# Patient Record
Sex: Female | Born: 1945 | Race: Black or African American | Hispanic: No | Smoking: Never smoker
Health system: Southern US, Community
[De-identification: ages and names within clinical notes are randomized; demographics above are authoritative.]

## PROBLEM LIST (undated history)

## (undated) DIAGNOSIS — F29 Unspecified psychosis not due to a substance or known physiological condition: Secondary | ICD-10-CM

## (undated) DIAGNOSIS — I10 Essential (primary) hypertension: Secondary | ICD-10-CM

## (undated) DIAGNOSIS — F329 Major depressive disorder, single episode, unspecified: Secondary | ICD-10-CM

## (undated) DIAGNOSIS — Z8781 Personal history of (healed) traumatic fracture: Secondary | ICD-10-CM

## (undated) DIAGNOSIS — F32A Depression, unspecified: Secondary | ICD-10-CM

## (undated) HISTORY — DX: Unspecified psychosis not due to a substance or known physiological condition: F29

## (undated) HISTORY — DX: Essential (primary) hypertension: I10

## (undated) HISTORY — DX: Personal history of (healed) traumatic fracture: Z87.81

## (undated) HISTORY — DX: Depression, unspecified: F32.A

## (undated) HISTORY — DX: Major depressive disorder, single episode, unspecified: F32.9

---

## 1969-01-31 HISTORY — PX: TUBAL LIGATION: SHX77

## 1987-02-01 DIAGNOSIS — Z8781 Personal history of (healed) traumatic fracture: Secondary | ICD-10-CM

## 1987-02-01 HISTORY — DX: Personal history of (healed) traumatic fracture: Z87.81

## 2000-05-08 ENCOUNTER — Encounter: Admission: RE | Admit: 2000-05-08 | Discharge: 2000-05-26 | Payer: Self-pay | Admitting: Family Medicine

## 2003-02-01 HISTORY — PX: CARPAL TUNNEL RELEASE: SHX101

## 2003-04-10 ENCOUNTER — Ambulatory Visit (HOSPITAL_COMMUNITY): Admission: RE | Admit: 2003-04-10 | Discharge: 2003-04-10 | Payer: Self-pay | Admitting: General Surgery

## 2004-01-15 ENCOUNTER — Ambulatory Visit (HOSPITAL_BASED_OUTPATIENT_CLINIC_OR_DEPARTMENT_OTHER): Admission: RE | Admit: 2004-01-15 | Discharge: 2004-01-15 | Payer: Self-pay

## 2004-03-10 LAB — HM MAMMOGRAPHY: HM Mammogram: NORMAL

## 2005-04-14 ENCOUNTER — Ambulatory Visit: Payer: Self-pay | Admitting: Family Medicine

## 2005-05-26 ENCOUNTER — Ambulatory Visit: Payer: Self-pay | Admitting: Family Medicine

## 2005-07-11 ENCOUNTER — Ambulatory Visit: Payer: Self-pay | Admitting: Cardiology

## 2005-07-12 ENCOUNTER — Ambulatory Visit: Payer: Self-pay | Admitting: Cardiology

## 2005-07-12 ENCOUNTER — Observation Stay (HOSPITAL_COMMUNITY): Admission: AD | Admit: 2005-07-12 | Discharge: 2005-07-13 | Payer: Self-pay | Admitting: Internal Medicine

## 2005-07-14 ENCOUNTER — Emergency Department (HOSPITAL_COMMUNITY): Admission: EM | Admit: 2005-07-14 | Discharge: 2005-07-14 | Payer: Self-pay | Admitting: Emergency Medicine

## 2005-07-14 ENCOUNTER — Ambulatory Visit: Payer: Self-pay | Admitting: Family Medicine

## 2005-07-25 ENCOUNTER — Ambulatory Visit: Payer: Self-pay | Admitting: Family Medicine

## 2005-07-28 ENCOUNTER — Ambulatory Visit: Payer: Self-pay | Admitting: Cardiology

## 2007-06-11 ENCOUNTER — Encounter: Payer: Self-pay | Admitting: Family Medicine

## 2007-08-22 DIAGNOSIS — F329 Major depressive disorder, single episode, unspecified: Secondary | ICD-10-CM | POA: Insufficient documentation

## 2007-08-22 DIAGNOSIS — F29 Unspecified psychosis not due to a substance or known physiological condition: Secondary | ICD-10-CM | POA: Insufficient documentation

## 2007-08-22 DIAGNOSIS — F3289 Other specified depressive episodes: Secondary | ICD-10-CM | POA: Insufficient documentation

## 2007-10-01 ENCOUNTER — Encounter: Payer: Self-pay | Admitting: Family Medicine

## 2007-11-08 ENCOUNTER — Encounter: Payer: Self-pay | Admitting: Family Medicine

## 2008-01-08 ENCOUNTER — Encounter: Payer: Self-pay | Admitting: Family Medicine

## 2008-03-03 ENCOUNTER — Ambulatory Visit: Payer: Self-pay | Admitting: Family Medicine

## 2008-03-03 DIAGNOSIS — N950 Postmenopausal bleeding: Secondary | ICD-10-CM | POA: Insufficient documentation

## 2008-03-03 DIAGNOSIS — R5383 Other fatigue: Secondary | ICD-10-CM

## 2008-03-03 DIAGNOSIS — G4700 Insomnia, unspecified: Secondary | ICD-10-CM | POA: Insufficient documentation

## 2008-03-03 DIAGNOSIS — R5381 Other malaise: Secondary | ICD-10-CM | POA: Insufficient documentation

## 2008-03-04 ENCOUNTER — Encounter: Payer: Self-pay | Admitting: Family Medicine

## 2008-03-04 LAB — CONVERTED CEMR LAB
Basophils Absolute: 0 10*3/uL (ref 0.0–0.1)
Basophils Relative: 1 % (ref 0–1)
CO2: 24 meq/L (ref 19–32)
Calcium: 9.3 mg/dL (ref 8.4–10.5)
Cholesterol: 214 mg/dL — ABNORMAL HIGH (ref 0–200)
Eosinophils Absolute: 0.1 10*3/uL (ref 0.0–0.7)
Eosinophils Relative: 1 % (ref 0–5)
HDL: 83 mg/dL (ref >39)
Hemoglobin: 13.9 g/dL (ref 12.0–15.0)
LDL Cholesterol: 114 mg/dL — ABNORMAL HIGH (ref 0–99)
Lymphocytes Relative: 40 % (ref 12–46)
MCV: 82 fL (ref 78.0–100.0)
Monocytes Relative: 5 % (ref 3–12)
Neutro Abs: 2.4 10*3/uL (ref 1.7–7.7)
Potassium: 4 meq/L (ref 3.5–5.3)
RBC: 5.22 M/uL — ABNORMAL HIGH (ref 3.87–5.11)
Sodium: 143 meq/L (ref 135–145)
WBC: 4.4 10*3/uL (ref 4.0–10.5)

## 2008-03-19 ENCOUNTER — Encounter: Payer: Self-pay | Admitting: Family Medicine

## 2008-04-02 ENCOUNTER — Encounter: Payer: Self-pay | Admitting: Family Medicine

## 2010-03-30 ENCOUNTER — Encounter: Payer: Self-pay | Admitting: Family Medicine

## 2010-04-07 ENCOUNTER — Encounter (INDEPENDENT_AMBULATORY_CARE_PROVIDER_SITE_OTHER): Payer: BC Managed Care – PPO | Admitting: Family Medicine

## 2010-04-07 ENCOUNTER — Other Ambulatory Visit: Payer: Self-pay | Admitting: Family Medicine

## 2010-04-07 ENCOUNTER — Other Ambulatory Visit (HOSPITAL_COMMUNITY)
Admission: RE | Admit: 2010-04-07 | Discharge: 2010-04-07 | Disposition: A | Discharge status: Home / Self Care | Payer: BC Managed Care – PPO | Source: Ambulatory Visit | Attending: Family Medicine | Admitting: Family Medicine

## 2010-04-07 ENCOUNTER — Encounter: Payer: Self-pay | Admitting: Family Medicine

## 2010-04-07 DIAGNOSIS — Z23 Encounter for immunization: Secondary | ICD-10-CM

## 2010-04-07 DIAGNOSIS — Z1211 Encounter for screening for malignant neoplasm of colon: Secondary | ICD-10-CM

## 2010-04-07 DIAGNOSIS — Z0000 Encounter for general adult medical examination without abnormal findings: Secondary | ICD-10-CM

## 2010-04-07 DIAGNOSIS — Z124 Encounter for screening for malignant neoplasm of cervix: Secondary | ICD-10-CM

## 2010-04-07 DIAGNOSIS — Z139 Encounter for screening, unspecified: Secondary | ICD-10-CM

## 2010-04-07 DIAGNOSIS — Z01419 Encounter for gynecological examination (general) (routine) without abnormal findings: Secondary | ICD-10-CM | POA: Insufficient documentation

## 2010-04-07 DIAGNOSIS — Z2911 Encounter for prophylactic immunotherapy for respiratory syncytial virus (RSV): Secondary | ICD-10-CM

## 2010-04-07 LAB — CONVERTED CEMR LAB: OCCULT 1: NEGATIVE

## 2010-04-08 DIAGNOSIS — R7301 Impaired fasting glucose: Secondary | ICD-10-CM | POA: Insufficient documentation

## 2010-04-08 LAB — CONVERTED CEMR LAB
BUN: 13 mg/dL (ref 6–23)
CO2: 23 meq/L (ref 19–32)
Calcium: 8.7 mg/dL (ref 8.4–10.5)
Chloride: 106 meq/L (ref 96–112)
Eosinophils Relative: 3 % (ref 0–5)
Glucose, Bld: 81 mg/dL (ref 70–99)
HCT: 35.6 % — ABNORMAL LOW (ref 36.0–46.0)
Lymphs Abs: 1.8 10*3/uL (ref 0.7–4.0)
Monocytes Absolute: 0.3 10*3/uL (ref 0.1–1.0)
Monocytes Relative: 7 % (ref 3–12)
Neutro Abs: 2 10*3/uL (ref 1.7–7.7)
Platelets: 244 10*3/uL (ref 150–400)
Potassium: 3.9 meq/L (ref 3.5–5.3)
RBC: 4.84 M/uL (ref 3.87–5.11)
RDW: 17.5 % — ABNORMAL HIGH (ref 11.5–15.5)
Sodium: 142 meq/L (ref 135–145)
Total CHOL/HDL Ratio: 2.6
WBC: 4.2 10*3/uL (ref 4.0–10.5)

## 2010-04-08 LAB — CBC WITH DIFFERENTIAL/PLATELET
Basophils Relative: 0 % (ref 0–1)
HCT: 35.6 % — ABNORMAL LOW (ref 36.0–46.0)
Lymphocytes Relative: 43 % (ref 12–46)
MCV: 73.6 fL — ABNORMAL LOW (ref 78.0–100.0)
Monocytes Absolute: 0.3 10*3/uL (ref 0.1–1.0)
Neutro Abs: 2 10*3/uL (ref 1.7–7.7)
Platelets: 244 10*3/uL (ref 150–400)
RBC: 4.84 MIL/uL (ref 3.87–5.11)
WBC: 4.2 10*3/uL (ref 4.0–10.5)

## 2010-04-08 LAB — VITAMIN D 25 HYDROXY (VIT D DEFICIENCY, FRACTURES): Vit D, 25-Hydroxy: 12 ng/mL — ABNORMAL LOW (ref 30–89)

## 2010-04-08 LAB — LIPID PANEL
Cholesterol: 195 mg/dL (ref 0–200)
VLDL: 23 mg/dL (ref 0–40)

## 2010-04-08 LAB — ANEMIA PANEL
Ferritin: 10 ng/mL (ref 10–291)
Folate: >20 ng/mL
Iron: 88 ug/dL (ref 42–145)
RBC.: 4.78 MIL/uL (ref 3.87–5.11)
Retic Ct Pct: 0.9 % (ref 0.4–3.1)
UIBC: 368 ug/dL
Vitamin B-12: 548 pg/mL (ref 211–911)

## 2010-04-08 LAB — HEMOGLOBIN A1C: Hgb A1c MFr Bld: 6.3 % — ABNORMAL HIGH (ref <5.7)

## 2010-04-08 LAB — BASIC METABOLIC PANEL
BUN: 13 mg/dL (ref 6–23)
CO2: 23 mEq/L (ref 19–32)
Creat: 0.83 mg/dL (ref 0.40–1.20)
Potassium: 3.9 mEq/L (ref 3.5–5.3)
Sodium: 142 mEq/L (ref 135–145)

## 2010-04-12 ENCOUNTER — Encounter: Payer: Self-pay | Admitting: Family Medicine

## 2010-04-12 DIAGNOSIS — E669 Obesity, unspecified: Secondary | ICD-10-CM | POA: Insufficient documentation

## 2010-04-12 LAB — CONVERTED CEMR LAB: Retic Ct Pct: 0.9 % (ref 0.4–3.1)

## 2010-04-13 ENCOUNTER — Encounter: Payer: Self-pay | Admitting: Family Medicine

## 2010-04-15 ENCOUNTER — Ambulatory Visit (HOSPITAL_COMMUNITY)
Admission: RE | Admit: 2010-04-15 | Discharge: 2010-04-15 | Disposition: A | Discharge status: Home / Self Care | Payer: BC Managed Care – PPO | Source: Ambulatory Visit | Attending: Family Medicine | Admitting: Family Medicine

## 2010-04-15 DIAGNOSIS — Z139 Encounter for screening, unspecified: Secondary | ICD-10-CM

## 2010-04-15 DIAGNOSIS — Z1231 Encounter for screening mammogram for malignant neoplasm of breast: Secondary | ICD-10-CM | POA: Insufficient documentation

## 2010-04-19 ENCOUNTER — Telehealth: Payer: Self-pay | Admitting: Family Medicine

## 2010-04-20 NOTE — Letter (Signed)
Summary: Pap Smear, Normal Letter, [facility]  [address]   [place], [place] [address]   Phone: [phone]  Fax: [phone]          April 12, 2010    Dear: Amber Fernandez    I am pleased to notify you that your PAP smear was normal.  You will need your next PAP smear in:     ____ 3 Months    ____ 6 Months    ____ 12 Months    Please call the office at our office number above, to schedule your next appointment.    Sincerely,     [facility]

## 2010-04-20 NOTE — Letter (Signed)
Summary: lab add on  lab add on   Imported By: Luann Bullins 04/13/2010 08:57:47  _____________________________________________________________________  External Attachment:    Type:   Image     Comment:   External Document

## 2010-04-20 NOTE — Letter (Signed)
Summary: Letter  Letter   Imported By: Lind Guest 04/13/2010 10:35:32  _____________________________________________________________________  External Attachment:    Type:   Image     Comment:   External Document

## 2010-04-20 NOTE — Assessment & Plan Note (Signed)
Summary: physical   Vital Signs:  Patient profile:   65 year old female Menstrual status:  postmenopausal Height:      59 inches Weight:      161 pounds BMI:     32.64 O2 Sat:      99 % Pulse rate:   91 / minute Pulse rhythm:   regular Resp:     16 per minute BP sitting:   120 / 78  (left arm)  Vitals Entered By: Everitt Amber LPN (April 06, 1608 3:05 PM)  Nutrition Counseling: Patient's BMI is greater than 25 and therefore counseled on weight management options. CC: CPE  Vision Screening:Left eye with correction: 20 / 25 Right eye with correction: 20 / 25 Both eyes with correction: 20 / 20  Color vision testing: normal      Vision Entered By: Everitt Amber LPN (April 07, 9602 3:09 PM)     Menstrual Status postmenopausal   CC:  CPE.  History of Present Illness: Reports  that she is doing fairly well. Denies recent fever or chills. Denies sinus pressure, nasal congestion , ear pain or sore throat. Denies chest congestion, or cough productive of sputum. Denies chest pain, palpitations, PND, orthopnea or leg swelling. Denies abdominal pain, nausea, vomitting, diarrhea or constipation. Denies change in bowel movements or bloody stool. Denies dysuria , frequency, incontinence or hesitancy. . Denies headaches, vertigo, seizures. Denies depression, anxiety or insomnia.has been on mediation in the past but reports mental stability off meds for some time Denies  rash, lesions, or itch.     Current Medications (verified): 1)  None  Allergies (verified): 1)  ! Codeine  Past History:  Past Medical History: Current Problems:  UNSPECIFIED PSYCHOSIS (ICD-298.9) DEPRESSION (ICD-311) chronic leg pain s/p  trauma  Past Surgical History: Tubal ligation (1971) right hand surgery for carpal tunnel synd  and 1st dorsal compartment  dr Cypher 06/14/2003  Family History: Mother living - HTN, cancer of the vulva diagnosed in stage 61 age 40 in 06/14/2010 Father deceased 79 - cause  unknown Three sisters deceased - 2 at birth one sister living - healthy, one HTN, one died at age 50 in 2009-06-13 six brothers living - x2 diabetic, 1 died in 53 at age 40 due to cirrhosis and a second died at age 47 due to mI he was diabetic  Social History: Employed in distribution center Widow 4 children Never Smoked Alcohol use-no Drug use-no  Review of Systems      See HPI General:  Complains of fatigue. Eyes:  Denies discharge and red eye. MS:  Complains of joint pain, loss of strength, low back pain, and thoracic pain. Endo:  Denies cold intolerance, excessive hunger, excessive thirst, and excessive urination. Heme:  Denies abnormal bruising, bleeding, and enlarge lymph nodes. Allergy:  Denies hives or rash, itching eyes, and persistent infections.  Physical Exam  General:  Well-developed,well-nourished,in no acute distress; alert,appropriate and cooperative throughout examination Head:  Normocephalic and atraumatic without obvious abnormalities. No apparent alopecia or balding. Eyes:  No corneal or conjunctival inflammation noted. EOMI. Perrla. Funduscopic exam benign, without hemorrhages, exudates or papilledema. Vision grossly normal. Ears:  External ear exam shows no significant lesions or deformities.  Otoscopic examination reveals clear canals, tympanic membranes are intact bilaterally without bulging, retraction, inflammation or discharge. Hearing is grossly normal bilaterally. Nose:  External nasal examination shows no deformity or inflammation. Nasal mucosa are pink and moist without lesions or exudates. Mouth:  pharynx pink and moist  and fair dentition.   Neck:  No deformities, masses, or tenderness noted. Chest Wall:  No deformities, masses, or tenderness noted. Breasts:  No mass, nodules, thickening, tenderness, bulging, retraction, inflamation, nipple discharge or skin changes noted.   Lungs:  Normal respiratory effort, chest expands symmetrically. Lungs are clear to  auscultation, no crackles or wheezes. Heart:  Normal rate and regular rhythm. S1 and S2 normal without gallop, murmur, click, rub or other extra sounds. Abdomen:  Bowel sounds positive,abdomen soft and non-tender without masses, organomegaly or hernias noted. Rectal:  No external abnormalities noted. Normal sphincter tone. No rectal masses or tenderness. Genitalia:  Normal introitus for age, no external lesions, no vaginal discharge, mucosa pink and moist, no vaginal or cervical lesions, no vaginal atrophy, no friaility or hemorrhage, normal uterus size and position, no adnexal masses or tenderness Msk:  No deformity or scoliosis noted of thoracic or lumbar spine.   Pulses:  R and L carotid,radial,femoral,dorsalis pedis and posterior tibial pulses are full and equal bilaterally Extremities:  decreased rOm thoracolumbar spine , adequatein shoulders, hips and knees Neurologic:  No cranial nerve deficits noted. Station and gait are normal. Plantar reflexes are down-going bilaterally. DTRs are symmetrical throughout. Sensory, motor and coordinative functions appear intact. Skin:  Intact without suspicious lesions or rashes Cervical Nodes:  No lymphadenopathy noted Axillary Nodes:  No palpable lymphadenopathy Inguinal Nodes:  No significant adenopathy Psych:  Cognition and judgment appear intact. Alert and cooperative with normal attention span and concentration. No apparent delusions, illusions, hallucinations   Impression & Recommendations:  Problem # 1:  OBESITY (ICD-278.00) Assessment Comment Only  Ht: 59 (04/07/2010)   Wt: 161 (04/07/2010)   BMI: 32.64 (04/07/2010) therapeutic lifestyle change discussed and encouraged  Problem # 2:  IMPAIRED FASTING GLUCOSE (ICD-790.21) Assessment: Comment Only  Orders: T- Hemoglobin A1C (83036-23375)6.3 Pt advised to reduce carbohydrate intake, espescially sweets, and to start regular physical activity, at least 30 minutes 5 days weekly, to enable  weight loss, and reduce the risk of becoming diabetic   Complete Medication List: 1)  Aspir-low 81 Mg Tbec (Aspirin) .... Take 1 tablet by mouth once a day 2)  Oscal 500/200 D-3 500-200 Mg-unit Tabs (Calcium carbonate-vitamin d) .... Take 1 tablet by mouth three times a day 3)  Womens Multivitamin Plus Tabs (Multiple vitamins-minerals) .... Take 1 tablet by mouth once a day  Other Orders: T-Basic Metabolic Panel [id number]) T-Lipid Profile [phone]) T-CBC w/Diff [id number]) T-TSH [id number]) T-Vitamin D (25-Hydroxy) [id number]) Radiology Referral (Radiology) Zoster (Shingles) Vaccine Live [phone]) Admin 1st Vaccine ([address]) Pap Smear (03474) Hemoccult Guaiac-1 spec.(in office) (82270)  Patient Instructions: 1)  Follow up appointment in 5.76months 2)  It is important that you exercise regularly at least 20 minutes 5 times a week. If you develop chest pain, have severe difficulty breathing, or feel very tired , stop exercising immediately and seek medical attention. 3)  You need to lose weight. Consider a lower calorie diet and regular exercise.  4)  BMP prior to visit, ICD-9:chem 7, lipid and TSH labs today 5)  CBC w/ Diff prior to visit, ICD-9: 6)  Vitamin D 7)  Mamogram past due we will schedule. 8)  Zostavax today 9)  We will try and locate your colonscopy, if normal you need one every 10 yrs  Prescriptions: WOMENS MULTIVITAMIN PLUS  TABS (MULTIPLE VITAMINS-MINERALS) Take 1 tablet by mouth once a day  #100 x 3   Entered and Authorized by:   Syliva Overman MD   Signed by:  Syliva Overman MD on 04/07/2010   Method used:   Electronically to        [facility]* (retail)       [address]       [place], [place]  [address]       Ph: [phone]       Fax: [phone]   RxID:   [id number] OSCAL 500/200 D-3 500-200 MG-UNIT TABS (CALCIUM CARBONATE-VITAMIN D) Take 1 tablet by mouth three times a day  #90 x 11   Entered  and Authorized by:   Syliva Overman MD   Signed by:   Syliva Overman MD on 04/07/2010   Method used:   Electronically to        [facility]* (retail)       [address]       [place], [place]  [address]       Ph: [phone]       Fax: [phone]   RxID:   [id number] ASPIR-LOW 81 MG TBEC (ASPIRIN) Take 1 tablet by mouth once a day  #100 x 3   Entered and Authorized by:   Syliva Overman MD   Signed by:   Syliva Overman MD on 04/07/2010   Method used:   Electronically to        [facility]* (retail)       [address]       [place], [place]  [address]       Ph: [phone]       Fax: [phone]   RxID:   [id number]    Orders Added: 1)  Est. Patient 40-64 years [[address]396] 2)  T-Basic Metabolic Panel [id number] 3)  T-Lipid Profile [id number] 4)  T-CBC w/Diff [99371-69678] 5)  T-TSH [93810-17510] 6)  T-Vitamin D (25-Hydroxy) [id number] 7)  T- Hemoglobin A1C [83036-23375] 8)  Radiology Referral [Radiology] 9)  Zoster (Shingles) Vaccine Live [90736] 10)  Admin 1st Vaccine [90471] 11)  Pap Smear [88150] 12)  Hemoccult Guaiac-1 spec.(in office) [82270]   Immunizations Administered:  Zostavax # 1:    Vaccine Type: Zostavax    Site: left deltoid    Mfr: Merck    Dose: 0.5 ml    Route: IM    Given by: Adella Hare LPN    Exp. Date: 12/06/2010    Lot #: [id number]    VIS given: 11/12/04 given April 07, 2010.   Immunizations Administered:  Zostavax # 1:    Vaccine Type: Zostavax    Site: left deltoid    Mfr: Merck    Dose: 0.5 ml    Route: IM    Given by: Adella Hare LPN    Exp. Date: 12/06/2010    Lot #: [id number]    VIS given: 11/12/04 given April 07, 2010.     Laboratory Results  Date/Time Received: April 07, 2010 4:00 PM  Date/Time Reported: April 07, 2010 4:00 PM   Stool - Occult Blood Hemmoccult #1: negative Date: 04/07/2010 Comments: 5030  5/14 51301 13L 10/13 Adella Hare LPN  April 06, 84 4:01 PM

## 2010-04-21 ENCOUNTER — Telehealth: Payer: Self-pay | Admitting: Family Medicine

## 2010-04-22 NOTE — Telephone Encounter (Signed)
Advise pt that since tingling  Continues to be a concern, she needs to have nerve conduction twests done for further eval pls let me know if she want sa referral now

## 2010-04-23 ENCOUNTER — Other Ambulatory Visit: Payer: Self-pay | Admitting: Family Medicine

## 2010-04-23 DIAGNOSIS — G5600 Carpal tunnel syndrome, unspecified upper limb: Secondary | ICD-10-CM

## 2010-04-29 NOTE — Progress Notes (Signed)
  Phone Note Call from Patient   Summary of Call: Patient called in and states that where she recieved the Zostavax on 04/07/10 was red around the area and still sore and feels like its numb also. Wants to know what needs to be done about it Call at work [phone] Initial call taken by: Everitt Amber LPN,  April 19, 2010 10:16 AM  Follow-up for Phone Call        nurse visit asap to look at the area ensure not infected, in the interim, use tylenol for pain  Follow-up by: Syliva Overman MD,  April 19, 2010 11:19 AM  Additional Follow-up for Phone Call Additional follow up Details #1::        Patient can't come until tomorrow around 2pm. Will use Tylenol in the meantime Additional Follow-up by: Everitt Amber LPN,  April 19, 2010 11:36 AM

## 2010-05-28 ENCOUNTER — Encounter: Payer: Self-pay | Admitting: Family Medicine

## 2010-06-18 NOTE — Discharge Summary (Signed)
NAMEMARYCARMEN, HAGEY NO.:  [id number]   MEDICAL RECORD NO.:  [id number]          PATIENT TYPE:  INP   LOCATION:  3702                         FACILITY:  [facility]   PHYSICIAN:  Jonelle Sidle, M.D. LHCDATE OF BIRTH:  November 27, 1945   DATE OF ADMISSION:  07/12/2005  DATE OF DISCHARGE:  07/13/2005                                 DISCHARGE SUMMARY   PRIMARY CARDIOLOGIST:  Dr. Andee Lineman.   PRINCIPAL DIAGNOSIS:  1.  Noncardiac chest pain.      1.  Normal coronary angiogram.   SECONDARY DIAGNOSES:  1.  History of hyperlipidemia.  2.  Chronic back pain.  3.  Family history of premature coronary artery disease.   PROCEDURES:  Diagnostic coronary angiogram by Dr. Charlies Constable June 12.   REASON FOR ADMISSION:  Ms. Kocsis is a 65 year old female with no prior  cardiac history who was initially referred to Dr. Lewayne Bunting at [facility] for evaluation of chest pain felt to have typical/atypical  features.  Serial cardiac markers were negative.  The patient was referred  for diagnostic coronary angiography for definitive exclusion of significant  coronary artery disease.   HOSPITAL COURSE:  Following transfer, the patient underwent same-day cardiac  catheterization by Dr. Charlies Constable revealing normal coronary arteries,  normal left ventricle, and a normal ascending aorta.  There were no noted  complications.   The patient was kept for overnight observation, cleared for discharge the  following morning in hemodynamically stable condition.  There were no  complications of the right groin incision site.   DISCHARGE LABORATORIES:  WBC 3.9, hemoglobin 13.5, platelet 210.  Sodium  140, potassium 4.3, glucose 106, BUN 13, creatinine 1.9.   MEDICATIONS:  None.   INSTRUCTIONS:  Patient is referred to cardiac catheterization discharge  sheet.   FOLLOW-UP:  1.  Dr. Lewayne Bunting on June 28 at 12:10 p.m. at [facility] in [place]      for a follow-up groin check.  2.   Patient instructed to arrange follow-up with Dr. Syliva Overman in one      week.   DISPOSITION:  Stable.  Dictation duration less than 30 medicines.      Gene Serpe, P.A. [facility]      Jonelle Sidle, M.D. [facility]  Electronically Signed    GS/MEDQ  D:  07/13/2005  T:  07/13/2005  Job:  [id number]   cc:   [facility] [place]  [address]  [place], [place] [address]   Milus Mallick. Lodema Hong, M.D.  Fax: [phone]

## 2010-06-18 NOTE — Op Note (Signed)
Amber Fernandez, Amber Fernandez               ACCOUNT NO.:  [id number]   MEDICAL RECORD NO.:  [id number]          PATIENT TYPE:  AMB   LOCATION:  DSC                          FACILITY:  [facility]   PHYSICIAN:  Katy Fitch. Sypher Montez Hageman., M.D.DATE OF BIRTH:  [birth year]   DATE OF PROCEDURE:  01/15/2004  DATE OF DISCHARGE:                                 OPERATIVE REPORT   PREOPERATIVE DIAGNOSES:  1.  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  2.  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   POSTOPERATIVE DIAGNOSES:  1.  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  2.  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   OPERATION:  1.  Release of right first dorsal compartment with resection of septum      between extensor pollicis brevis and abductor pollicis longus tendons.  2.  Release of right transverse carpal ligament through separate palmar      incision.   SURGEON:  Katy Fitch. Sypher, M.D.   ASSISTANT:  Marveen Reeks. Dasnoit, P.A.C.   ANESTHESIA:  General by LMA.   SUPERVISING ANESTHESIOLOGIST:  Janetta Hora. Gelene Mink, M.D.   INDICATIONS FOR PROCEDURE:  Gerlene Glassburn is a 65 year old right-hand  dominant woman referred by Dr. Lunette Stands for evaluation and management of  a painful right wrist and hand numbness.  Clinical examination revealed  signs of chronic stenosing tenosynovitis of her right first dorsal  compartment and signs of chronic median entrapment neuropathy.  Electrodiagnostic studies performed by Dr. Johna Roles confirmed median  neuropathy bilaterally.   Ms. Redditt had prior steroids injections and a prolonged period of splinting  without relief of her first dorsal compartment swelling and pain.   She is now advised to proceed with release of the first dorsal compartment  with tenolysis of the abductor pollicis longus and extensor pollicis brevis  tendons as well as release of the right  transverse carpal ligament.   DESCRIPTION OF PROCEDURE:  Jakelyn Squyres is brought to the operating room  and placed in the supine position on the operating table.   Following the induction of general anesthesia by LMA, the right arm was  prepped with Betadine soap and solution and sterilely draped.  Following  exsanguination of the limb with the Esmarch bandage, the arterial tourniquet  was inflated to 220 mmHg.  Procedure commenced with a short transverse  incision directly over the palpably thickened first dorsal compartment.  Subcutaneous tissue was carefully divided revealing an opaque very thickened  compartment. This was then carefully isolated with retraction of the radial  sensory branches followed by incision with the scalpel.   The wall thickness of the compartment had increased to more than 4 mm and  there were significant areas of calcification within the substance of the  retinaculum.   The scissors were used to extend the release proximally and distally  exposing a large caliber extensor pollicis brevis tendon.  A second more  palmar incision was fashioned revealing two slips of the adductor pollicis  longus.   The rather thickened septum between the two compartments was  resected with  scissors while carefully protecting the nerves.   Thereafter, free range of motion of the thumb and wrist was recovered.   This wound was then repaired with intradermal 3-0 Prolene and a Steri-Strip.   Attention was then directed to the palm.  A short incision was fashioned in  line of the ring finger.  Subcutaneous tissues were carefully divided along  the palmar fascia.  This was split longitudinally __________ branch of the  median nerve.  These were followed back towards transverse carpal ligament  which was carefully isolated from the median nerve.  The ligament was  released along its ulnar border extending to the distal forearm.  This  widely opened carpal canal.  No masses or  other predicaments were noted.   Bleeding points along the margin of the released ligament were  electrocauterized with bipolar current followed by repair of the skin with  intradermal 3-0 Prolene suture.   A compressive dressing applied with volar plaster splint maintaining the  wrist in 5 degrees of dorsiflexion.   For aftercare, Ms. Heather was given a prescription for Dilaudid 2 mg one or  two tablets p.o. q.4-6h. p.r.n. pain, 20 tablets without refill.  She is  also encouraged to use Advil or Tylenol as an over-the-counter analgesic for  mild pain.      [place]   RVS/MEDQ  D:  01/15/2004  T:  01/15/2004  Job:  [id number]   cc:   Lunette Stands, M.D.  [address]  [place] [address]  Fax: [phone]

## 2010-06-18 NOTE — Cardiovascular Report (Signed)
NAME:  Amber Fernandez, Amber Fernandez NO.:  [id number]   MEDICAL RECORD NO.:  [id number]          PATIENT TYPE:  INP   LOCATION:  3702                         FACILITY:  [facility]   PHYSICIAN:  Charlies Constable, M.D. [facility] DATE OF BIRTH:  01/01/1946   DATE OF PROCEDURE:  07/12/2005  DATE OF DISCHARGE:                              CARDIAC CATHETERIZATION   CLINICAL HISTORY:  Ms. Gilani is 65 years old and was recently admitted to  [facility] with chest pain.  She was seen in consultation by Dr.  Andee Lineman who felt that she should have evaluation with coronary angiography  and she was transferred to [facility] for this.  She does have a  brother who died of a myocardial infarction at age 97, according to Dr.  Margarita Mail note.   PROCEDURE:  The procedure was performed via right femoral artery and  arterial sheath and 6-French preformed coronary catheters.  Omnipaque  contrast was used.  An aortic root injection was performed to rule out  aortic dissection.  The right femoral was closed with Angioseal at the end  of the procedure.  The patient tolerated the procedure well and left the  laboratory in satisfactory condition.   RESULTS:  The aortic pressure was 120/71 with a mean of 92 and the left  ventricular pressure was 120/9.   Left main coronary artery:  Left main coronary artery was free of  significant disease.   Left anterior descending artery:  Left descending artery gave rise to three  diagonal branches and four septal perforators.  These and the LV proper were  free of significant disease.   Circumflex:  The circumflex gave rise to an atrial branch, two marginal  branches and two posterolateral branches.  These vessels were free of  disease.   Right coronary artery:  The right coronary artery was a moderate-sized  vessel that gave rise to a right ventricle branch, second right ventricle  branch which supplied part of the inferior septum and the posterior  descending and posterolateral branch.  These vessels were free of  significant disease.   Left ventriculogram:  The left ventriculogram from the RA projection showed  good wall motion with no areas of hypokinesis.  The estimated fraction was  60%.   Aortic root injection:  Aortic root injection showed no evidence of  dissection.   CONCLUSION:  Normal coronary angiography and left ventricular wall motion.   RECOMMENDATIONS:  Reassurance.  Will plan to keep the patient and discharge  the patient tomorrow.  Will arrange follow-up with Dr. Andee Lineman and Dr. Clelia Croft  and they can decide regarding further evaluation of the patient's chest  pain.           ______________________________  Charlies Constable, M.D. [facility]     BB/MEDQ  D:  07/12/2005  T:  07/12/2005  Job:  [id number]   cc:   Sherryll Burger, M.D.  [place], [place]   Learta Codding, M.D. [facility]  [address]  [address]  [place]  [place] [address]

## 2010-06-18 NOTE — H&P (Signed)
Amber Fernandez, PERSON NO.:  [id number]   MEDICAL RECORD NO.:  [id number]                  PATIENT TYPE:   LOCATION:                                       FACILITY:  [facility]   PHYSICIAN:  Dirk Dress. Katrinka Blazing, M.D.                DATE OF BIRTH:   DATE OF ADMISSION:  DATE OF DISCHARGE:                                HISTORY & PHYSICAL   HISTORY OF PRESENT ILLNESS:  [age]-year-old female referred for  screening colonoscopy.  She has a history of anemia that is being evaluated.  She has not had difficulty with bowel movements.  She denies bleeding or  melena.  She denies abdominal pain.  She is scheduled for screening  colonoscopy.   PAST HISTORY:  1. She has a history of chronic anemia.  2. A prior history of left ankle fracture.   PAST SURGICAL HISTORY:  Surgery includes tubal ligation.   FAMILY HISTORY:  Family history is positive for hypertension, diabetes and a  stroke.   PHYSICAL EXAMINATION:  GENERAL:  On exam, a healthy-appearing female in no  acute distress.  VITAL SIGNS:  Blood pressure 140/86, pulse 80, respirations 18.  Weight 144  pounds.  HEENT:  Normal.  NECK:  Neck is supple.  CHEST:  Chest clear to auscultation.  HEART:  Regular rate and rhythm without murmur, gallop or rub.  ABDOMEN:  Abdomen is soft and nontender.  No masses.  EXTREMITIES:  No cyanosis, clubbing or edema.  NEUROLOGIC:  No focal motor, sensory or cerebellar deficit.   IMPRESSION:  1. Need for screening colonoscopy.  2. Anemia.   PLAN:  Screening colonoscopy.     ___________________________________________                                         Dirk Dress. Katrinka Blazing, M.D.   LCS/MEDQ  D:  04/09/2003  T:  04/10/2003  Job:  [id number]

## 2010-09-20 ENCOUNTER — Encounter: Payer: Self-pay | Admitting: Family Medicine

## 2010-09-21 ENCOUNTER — Encounter: Payer: Self-pay | Admitting: Family Medicine

## 2010-09-21 ENCOUNTER — Ambulatory Visit (INDEPENDENT_AMBULATORY_CARE_PROVIDER_SITE_OTHER): Payer: BC Managed Care – PPO | Admitting: Family Medicine

## 2010-09-21 VITALS — BP 120/80 | HR 72 | Resp 16 | Ht 59.0 in | Wt 165.1 lb

## 2010-09-21 DIAGNOSIS — D509 Iron deficiency anemia, unspecified: Secondary | ICD-10-CM

## 2010-09-21 DIAGNOSIS — R7301 Impaired fasting glucose: Secondary | ICD-10-CM

## 2010-09-21 DIAGNOSIS — R7303 Prediabetes: Secondary | ICD-10-CM

## 2010-09-21 DIAGNOSIS — M549 Dorsalgia, unspecified: Secondary | ICD-10-CM

## 2010-09-21 DIAGNOSIS — Z23 Encounter for immunization: Secondary | ICD-10-CM

## 2010-09-21 DIAGNOSIS — R5381 Other malaise: Secondary | ICD-10-CM

## 2010-09-21 DIAGNOSIS — D649 Anemia, unspecified: Secondary | ICD-10-CM

## 2010-09-21 DIAGNOSIS — R7309 Other abnormal glucose: Secondary | ICD-10-CM

## 2010-09-21 DIAGNOSIS — E559 Vitamin D deficiency, unspecified: Secondary | ICD-10-CM

## 2010-09-21 DIAGNOSIS — E669 Obesity, unspecified: Secondary | ICD-10-CM

## 2010-09-21 DIAGNOSIS — R5383 Other fatigue: Secondary | ICD-10-CM

## 2010-09-21 MED ORDER — IBUPROFEN 800 MG PO TABS: 800.0000 mg | ORAL_TABLET | Freq: Three times a day (TID) | ORAL | Status: AC | PRN

## 2010-09-21 MED ORDER — METHYLPREDNISOLONE ACETATE 80 MG/ML IJ SUSP
80.0000 mg | Freq: Once | INTRAMUSCULAR | Status: AC
  Administered 2010-09-21: 80 mg via INTRAMUSCULAR

## 2010-09-21 MED ORDER — KETOROLAC TROMETHAMINE 30 MG/ML IJ SOLN
60.0000 mg | Freq: Once | INTRAMUSCULAR | Status: AC
  Administered 2010-09-21: 60 mg via INTRAMUSCULAR

## 2010-09-21 MED ORDER — PREDNISONE (PAK) 5 MG PO TABS: 5.0000 mg | ORAL_TABLET | ORAL | Status: DC

## 2010-09-21 NOTE — Patient Instructions (Signed)
F/u in  3.5 months.  Labs today.  Pls call and register for nutrition class, that will help you A LOT  Non fasting labd  5 to 7 days befiore next oV.  LABWORK  NEEDS TO BE DONE BETWEEN 3 TO 7 DAYS BEFORE YOUR NEXT SCEDULED  VISIT.  THIS WILL IMPROVE THE QUALITY OF YOUR CARE.   Injections and med for arthritic back pain.  It is important that you exercise regularly at least 30 minutes 5 times a week. If you develop chest pain, have severe difficulty breathing, or feel very tired, stop exercising immediately and seek medical attention    A healthy diet is rich in fruit, vegetables and whole grains. Poultry fish, nuts and beans are a healthy choice for protein rather then red meat. A low sodium diet and drinking 64 ounces of water daily is generally recommended. Oils and sweet should be limited. Carbohydrates especially for those who are diabetic or overweight, should be limited to 30-45 gram per meal. It is important to eat on a regular schedule, at least 3 times daily. Snacks should be primarily fruits, vegetables or nuts.

## 2010-09-21 NOTE — Progress Notes (Signed)
  Subjective:    Patient ID: Amber Fernandez, female    DOB: 1945/11/18, 65 y.o.   MRN: [id number]  HPI 3 week h/o left SI back pain, non radiating, was doing yard work, the day it started, no relief since then . Does not radiate, first time ever, no incontinence , no lower extremtiy weakness, or numbness. Pt has not been consistent in exercise and dietary change to promote weight loss despite being prediabetic, she has actually gained weigh, however vows to work to change this  Review of Systems See HPI Denies recent fever or chills. Denies sinus pressure, nasal congestion, ear pain or sore throat. Denies chest congestion, productive cough or wheezing. Denies chest pains, palpitations and leg swelling Denies abdominal pain, nausea, vomiting,diarrhea or constipation.   Denies dysuria, frequency, hesitancy or incontinence.  Denies headaches, seizures, numbness, or tingling. Denies depression, anxiety or insomnia. Denies skin break down or rash.        Objective:   Physical Exam Patient alert and oriented and in no cardiopulmonary distress.  HEENT: No facial asymmetry, EOMI, no sinus tenderness,  oropharynx pink and moist.  Neck supple no adenopathy.  Chest: Clear to auscultation bilaterally.  CVS: S1, S2 no murmurs, no S3.  ABD: Soft non tender. Bowel sounds normal.  Ext: No edema  DG:[id number] ROM spine,adequate in  shoulders, hips and knees.  Skin: Intact, no ulcerations or rash noted.  Psych: Good eye contact, normal affect. Memory intact not anxious or depressed appearing.  CNS: CN 2-12 intact, power, tone and sensation normal throughout.        Assessment & Plan:

## 2010-09-22 LAB — IRON AND TIBC
%SAT: 14 % — ABNORMAL LOW (ref 20–55)
Iron: 58 ug/dL (ref 42–145)
TIBC: 425 ug/dL (ref 250–470)
UIBC: 367 ug/dL

## 2010-09-22 LAB — CBC WITH DIFFERENTIAL/PLATELET
Basophils Relative: 1 % (ref 0–1)
Eosinophils Relative: 6 % — ABNORMAL HIGH (ref 0–5)
MCH: 25.3 pg — ABNORMAL LOW (ref 26.0–34.0)
Monocytes Relative: 6 % (ref 3–12)
Neutrophils Relative %: 42 % — ABNORMAL LOW (ref 43–77)
RDW: 15.7 % — ABNORMAL HIGH (ref 11.5–15.5)
WBC: 3.7 10*3/uL — ABNORMAL LOW (ref 4.0–10.5)

## 2010-09-22 LAB — FERRITIN: Ferritin: 17 ng/mL (ref 10–291)

## 2010-09-22 LAB — VITAMIN D 25 HYDROXY (VIT D DEFICIENCY, FRACTURES): Vit D, 25-Hydroxy: 20 ng/mL — ABNORMAL LOW (ref 30–89)

## 2010-09-22 LAB — FOLATE: Folate: >20 ng/mL

## 2010-09-22 MED ORDER — ERGOCALCIFEROL 1.25 MG (50000 UT) PO CAPS: 50000.0000 [IU] | ORAL_CAPSULE | ORAL | Status: AC

## 2010-10-04 NOTE — Assessment & Plan Note (Signed)
Behavioral modification discussed to reduce the risk of becoming diabetic or delay its onset

## 2010-10-04 NOTE — Assessment & Plan Note (Signed)
Anti inflammatories administered and prescribed 

## 2010-10-04 NOTE — Assessment & Plan Note (Signed)
Deteriorated. Patient re-educated about  the importance of commitment to a  minimum of 150 minutes of exercise per week. The importance of healthy food choices with portion control discussed. Encouraged to start a food diary, count calories and to consider  joining a support group. Sample diet sheets offered. Goals set by the patient for the next several months.    

## 2010-12-29 ENCOUNTER — Encounter: Payer: Self-pay | Admitting: Family Medicine

## 2011-01-04 ENCOUNTER — Ambulatory Visit: Payer: BC Managed Care – PPO | Admitting: Family Medicine

## 2012-04-18 ENCOUNTER — Ambulatory Visit (INDEPENDENT_AMBULATORY_CARE_PROVIDER_SITE_OTHER): Payer: BC Managed Care – PPO | Admitting: Family Medicine

## 2012-04-18 ENCOUNTER — Other Ambulatory Visit: Payer: Self-pay | Admitting: Family Medicine

## 2012-04-18 ENCOUNTER — Other Ambulatory Visit (HOSPITAL_COMMUNITY)
Admission: RE | Admit: 2012-04-18 | Discharge: 2012-04-18 | Disposition: A | Discharge status: Home / Self Care | Payer: BC Managed Care – PPO | Source: Ambulatory Visit | Attending: Family Medicine | Admitting: Family Medicine

## 2012-04-18 ENCOUNTER — Encounter: Payer: Self-pay | Admitting: Family Medicine

## 2012-04-18 VITALS — BP 124/80 | HR 99 | Resp 18 | Ht 59.0 in | Wt 158.0 lb

## 2012-04-18 DIAGNOSIS — Z1151 Encounter for screening for human papillomavirus (HPV): Secondary | ICD-10-CM | POA: Insufficient documentation

## 2012-04-18 DIAGNOSIS — Z139 Encounter for screening, unspecified: Secondary | ICD-10-CM

## 2012-04-18 DIAGNOSIS — R5383 Other fatigue: Secondary | ICD-10-CM

## 2012-04-18 DIAGNOSIS — Z01419 Encounter for gynecological examination (general) (routine) without abnormal findings: Secondary | ICD-10-CM | POA: Insufficient documentation

## 2012-04-18 DIAGNOSIS — E669 Obesity, unspecified: Secondary | ICD-10-CM

## 2012-04-18 DIAGNOSIS — R7301 Impaired fasting glucose: Secondary | ICD-10-CM

## 2012-04-18 DIAGNOSIS — Z1211 Encounter for screening for malignant neoplasm of colon: Secondary | ICD-10-CM

## 2012-04-18 DIAGNOSIS — M899 Disorder of bone, unspecified: Secondary | ICD-10-CM

## 2012-04-18 DIAGNOSIS — R7309 Other abnormal glucose: Secondary | ICD-10-CM

## 2012-04-18 DIAGNOSIS — M949 Disorder of cartilage, unspecified: Secondary | ICD-10-CM

## 2012-04-18 DIAGNOSIS — R5381 Other malaise: Secondary | ICD-10-CM

## 2012-04-18 DIAGNOSIS — Z1329 Encounter for screening for other suspected endocrine disorder: Secondary | ICD-10-CM

## 2012-04-18 DIAGNOSIS — R7303 Prediabetes: Secondary | ICD-10-CM

## 2012-04-18 DIAGNOSIS — Z0000 Encounter for general adult medical examination without abnormal findings: Secondary | ICD-10-CM | POA: Insufficient documentation

## 2012-04-18 DIAGNOSIS — Z13220 Encounter for screening for lipoid disorders: Secondary | ICD-10-CM

## 2012-04-18 NOTE — Patient Instructions (Addendum)
F/u in 6 month, call if you need me before please.  Mammogram to be scheduled at checkout.  It is important that you exercise regularly at least 30 minutes 5 times a week. If you develop chest pain, have severe difficulty breathing, or feel very tired, stop exercising immediately and seek medical attention   Please work on weight loss since you are prediabetic   Fasting lipid, chem 7, hBA1C, TSH and CBC and vit D as soon as possible  HBa1C in 6 month

## 2012-04-19 LAB — CBC
HCT: 40.7 % (ref 36.0–46.0)
Hemoglobin: 13.6 g/dL (ref 12.0–15.0)

## 2012-04-19 LAB — LIPID PANEL
Cholesterol: 200 mg/dL (ref 0–200)
HDL: 77 mg/dL (ref >39)
LDL Cholesterol: 100 mg/dL — ABNORMAL HIGH (ref 0–99)

## 2012-04-19 LAB — BASIC METABOLIC PANEL
CO2: 29 mEq/L (ref 19–32)
Chloride: 104 mEq/L (ref 96–112)
Creat: 0.74 mg/dL (ref 0.50–1.10)
Glucose, Bld: 92 mg/dL (ref 70–99)
Potassium: 4.5 mEq/L (ref 3.5–5.3)
Sodium: 139 mEq/L (ref 135–145)

## 2012-04-20 LAB — VITAMIN D 25 HYDROXY (VIT D DEFICIENCY, FRACTURES): Vit D, 25-Hydroxy: 34 ng/mL (ref 30–89)

## 2012-04-20 LAB — TSH: TSH: 1.636 u[IU]/mL (ref 0.350–4.500)

## 2012-04-22 NOTE — Progress Notes (Signed)
  Subjective:    Patient ID: Amber Fernandez, female    DOB: 1945-02-20, 67 y.o.   MRN: [id number]  HPI The PT is here for annual exam she is on no chronic prescription medications  Preventive health is updated, specifically  Cancer screening and Immunization.   There are no new concerns.  There are no specific complaints       Review of Systems See HPI Denies recent fever or chills. Denies sinus pressure, nasal congestion, ear pain or sore throat. Denies chest congestion, productive cough or wheezing. Denies chest pains, palpitations and leg swelling Denies abdominal pain, nausea, vomiting,diarrhea or constipation.   Denies dysuria, frequency, hesitancy or incontinence. Denies joint pain, swelling and limitation in mobility. Denies headaches, seizures, numbness, or tingling. Denies depression, anxiety or insomnia. Denies skin break down or rash.        Objective:   Physical Exam  Pleasant well nourished female, alert and oriented x 3, in no cardio-pulmonary distress. Afebrile. HEENT No facial trauma or asymetry. Sinuses non tender.  EOMI, PERTL, fundoscopic exam is normal, no hemorhage or exudate.  External ears normal, tympanic membranes clear. Oropharynx moist, no exudate, good dentition. Neck: supple, no adenopathy,JVD or thyromegaly.No bruits.  Chest: Clear to ascultation bilaterally.No crackles or wheezes. Non tender to palpation  Breast: No asymetry,no masses. No nipple discharge or inversion. No axillary or supraclavicular adenopathy  Cardiovascular system; Heart sounds normal,  S1 and  S2 ,no S3.  No murmur, or thrill. Apical beat not displaced Peripheral pulses normal.  Abdomen: Soft, non tender, no organomegaly or masses. No bruits. Bowel sounds normal. No guarding, tenderness or rebound.  Rectal:  No mass. Guaiac negative stool.  GU: External genitalia normal. No lesions. Vaginal canal normal.No discharge. Uterus normal size, no adnexal  masses, no cervical motion or adnexal tenderness.  Musculoskeletal exam: Full ROM of spine, hips , shoulders and knees. No deformity ,swelling or crepitus noted. No muscle wasting or atrophy.   Neurologic: Cranial nerves 2 to 12 intact. Power, tone ,sensation and reflexes normal throughout. No disturbance in gait. No tremor.  Skin: Intact, no ulceration, erythema , scaling or rash noted. Pigmentation normal throughout  Psych; Normal mood and affect. Judgement and concentration normal       Assessment & Plan:

## 2012-04-22 NOTE — Assessment & Plan Note (Addendum)
Annual exam as documented. Pt to work on regular exercise , dietary change and weight loss Cancer screeening needs updating, mammogram

## 2012-04-22 NOTE — Assessment & Plan Note (Signed)
Deteriorated Patient educated about the importance of limiting  Carbohydrate intake , the need to commit to daily physical activity for a minimum of 30 minutes , and to commit weight loss. The fact that changes in all these areas will reduce or eliminate all together the development of diabetes is stressed.    

## 2012-04-23 ENCOUNTER — Ambulatory Visit (HOSPITAL_COMMUNITY): Payer: BC Managed Care – PPO

## 2012-04-24 ENCOUNTER — Ambulatory Visit (HOSPITAL_COMMUNITY)
Admission: RE | Admit: 2012-04-24 | Discharge: 2012-04-24 | Disposition: A | Discharge status: Home / Self Care | Payer: BC Managed Care – PPO | Source: Ambulatory Visit | Attending: Family Medicine | Admitting: Family Medicine

## 2012-04-24 DIAGNOSIS — Z139 Encounter for screening, unspecified: Secondary | ICD-10-CM

## 2012-04-24 DIAGNOSIS — Z1231 Encounter for screening mammogram for malignant neoplasm of breast: Secondary | ICD-10-CM | POA: Insufficient documentation

## 2012-05-08 NOTE — Addendum Note (Signed)
Addended by: Kandis Fantasia B on: 05/08/2012 11:31 AM   Modules accepted: Orders

## 2012-05-22 ENCOUNTER — Telehealth: Payer: Self-pay | Admitting: Family Medicine

## 2012-05-22 NOTE — Telephone Encounter (Signed)
Called to see why she was needing this cream. Left message to call back

## 2012-05-30 NOTE — Telephone Encounter (Signed)
Patient never returned call  

## 2012-07-03 ENCOUNTER — Telehealth: Payer: Self-pay

## 2012-07-03 NOTE — Telephone Encounter (Signed)
Pt states she needs an rx for compression hose with the toes out. 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 Also Dr Orvan Falconer told her to get her PCP to write an rx for triamcinolone cream for when she goes out into the sun- she breaks out. She no longer goes to Dr Orvan Falconer. Can she get an rx for both the stockings and the cream?  Wants it sent to [facility] in [place]

## 2012-07-03 NOTE — Telephone Encounter (Signed)
Nothing on her problem list and active record has these problems she will need OV, pls let her know

## 2012-07-05 NOTE — Telephone Encounter (Signed)
Unable to reach patient.  Home phone no answer and no answering machine.  Cell phone is the wrong number.

## 2012-07-11 ENCOUNTER — Telehealth: Payer: Self-pay | Admitting: Family Medicine

## 2012-07-12 NOTE — Telephone Encounter (Signed)
Pt aware that she needs an appt and will call back later in the week to schedule

## 2012-10-17 ENCOUNTER — Ambulatory Visit (INDEPENDENT_AMBULATORY_CARE_PROVIDER_SITE_OTHER): Payer: BC Managed Care – PPO | Admitting: Family Medicine

## 2012-10-17 ENCOUNTER — Encounter: Payer: Self-pay | Admitting: Family Medicine

## 2012-10-17 VITALS — BP 122/80 | HR 85 | Resp 16 | Wt 160.4 lb

## 2012-10-17 DIAGNOSIS — Z23 Encounter for immunization: Secondary | ICD-10-CM | POA: Diagnosis not present

## 2012-10-17 DIAGNOSIS — M25569 Pain in unspecified knee: Secondary | ICD-10-CM

## 2012-10-17 DIAGNOSIS — F329 Major depressive disorder, single episode, unspecified: Secondary | ICD-10-CM

## 2012-10-17 DIAGNOSIS — M1712 Unilateral primary osteoarthritis, left knee: Secondary | ICD-10-CM | POA: Insufficient documentation

## 2012-10-17 DIAGNOSIS — R7301 Impaired fasting glucose: Secondary | ICD-10-CM | POA: Diagnosis not present

## 2012-10-17 DIAGNOSIS — F3289 Other specified depressive episodes: Secondary | ICD-10-CM

## 2012-10-17 DIAGNOSIS — R7309 Other abnormal glucose: Secondary | ICD-10-CM

## 2012-10-17 DIAGNOSIS — Z13820 Encounter for screening for osteoporosis: Secondary | ICD-10-CM

## 2012-10-17 DIAGNOSIS — M25562 Pain in left knee: Secondary | ICD-10-CM

## 2012-10-17 DIAGNOSIS — E669 Obesity, unspecified: Secondary | ICD-10-CM

## 2012-10-17 DIAGNOSIS — R7303 Prediabetes: Secondary | ICD-10-CM

## 2012-10-17 LAB — HEMOGLOBIN A1C: Hgb A1c MFr Bld: 5.9 % — ABNORMAL HIGH (ref <5.7)

## 2012-10-17 MED ORDER — CLOTRIMAZOLE-BETAMETHASONE 1-0.05 % EX CREA: TOPICAL_CREAM | Freq: Two times a day (BID) | CUTANEOUS | Status: DC

## 2012-10-17 NOTE — Progress Notes (Signed)
  Subjective:    Patient ID: Amber Fernandez, female    DOB: 1945/04/11, 67 y.o.   MRN: [id number]  HPI The PT is here for follow up and re-evaluation of chronic medical conditions, medication management and review of any available recent lab and radiology data.  Preventive health is updated, specifically  Cancer screening and Immunization.   Questions or concerns regarding consultations or procedures which the PT has had in the interim are  addressed. The PT denies any adverse reactions to current medications since the last visit.  Hit the left knee which has past h/o remote indirect trauma and pain with instability in December, since then increased pain and instability, no falls. Sometimes uses a cane.Ortho in [place] evaluated pt , recomeneded celebrex which is currently not very helpful, has not been back went to urgent care in [place], she was sent to Dr Case ortho in [place] celebrex was prescribed, she returned the next week it was fine, but now notes flare of problems since May/June  Has cut back on carbs and sweets no regular exercise, no weight loss, updated HBa1C today to re evaluate diabetic status      Review of Systems See HPI Denies recent fever or chills. Denies sinus pressure, nasal congestion, ear pain or sore throat. Denies chest congestion, productive cough or wheezing. Denies chest pains, palpitations and leg swelling Denies abdominal pain, nausea, vomiting,diarrhea or constipation.   Denies dysuria, frequency, hesitancy or incontinence. Denies headaches, seizures, numbness, or tingling. Denies uncontrolled  depression, anxiety or insomnia. Denies skin break down or rash.        Objective:   Physical Exam  Patient alert and oriented and in no cardiopulmonary distress.  HEENT: No facial asymmetry, EOMI, no sinus tenderness,  oropharynx pink and moist.  Neck supple no adenopathy.  Chest: Clear to auscultation bilaterally.  CVS: S1, S2 no murmurs, no S3.  ABD:  Soft non tender. Bowel sounds normal.  Ext: No edema  MS: Adequate ROM spine, shoulders, hips an reduced in left  Knee, with tenderness.  Skin: Intact, no ulcerations or rash noted.  Psych: Good eye contact, normal affect. Memory intact not anxious or depressed appearing.  CNS: CN 2-12 intact, power, tone and sensation normal throughout.       Assessment & Plan:

## 2012-10-17 NOTE — Patient Instructions (Addendum)
F/u in 4 months, call if you need, me before  Flu and pneumonia vaccine today  HBa1C today and we will contact you tomorrow re result, expect to start metformin if no better as we discussed.  Remember to measure carb portions, and commit to physical activity, daily for approx 30 mins   You are referred for bone density scan

## 2012-10-21 DIAGNOSIS — R7303 Prediabetes: Secondary | ICD-10-CM | POA: Insufficient documentation

## 2012-10-21 NOTE — Assessment & Plan Note (Signed)
At times disabling and unstable following fall in December, refer to ortho for 2nd opinion

## 2012-10-21 NOTE — Assessment & Plan Note (Signed)
Still c//o poor relationships with her children who she states blame her for their fsather's death. Not suicidal or homicidal, no hallucinations, no interest in therapy atr thsi time

## 2012-10-21 NOTE — Assessment & Plan Note (Signed)
Improved with change in diet, still needs to work on weight loss and continue dietary change to normalize blood sugar

## 2012-10-21 NOTE — Assessment & Plan Note (Signed)
Deteriorated. Patient re-educated about  the importance of commitment to a  minimum of 150 minutes of exercise per week. The importance of healthy food choices with portion control discussed. Encouraged to start a food diary, count calories and to consider  joining a support group. Sample diet sheets offered. Goals set by the patient for the next several months.    

## 2012-10-23 ENCOUNTER — Ambulatory Visit (HOSPITAL_COMMUNITY)
Admission: RE | Admit: 2012-10-23 | Discharge: 2012-10-23 | Disposition: A | Discharge status: Home / Self Care | Payer: BC Managed Care – PPO | Source: Ambulatory Visit | Attending: Family Medicine | Admitting: Family Medicine

## 2012-10-23 DIAGNOSIS — M899 Disorder of bone, unspecified: Secondary | ICD-10-CM | POA: Diagnosis not present

## 2012-10-23 DIAGNOSIS — Z13820 Encounter for screening for osteoporosis: Secondary | ICD-10-CM

## 2012-10-26 DIAGNOSIS — M1710 Unilateral primary osteoarthritis, unspecified knee: Secondary | ICD-10-CM | POA: Diagnosis not present

## 2012-10-26 DIAGNOSIS — IMO0002 Reserved for concepts with insufficient information to code with codable children: Secondary | ICD-10-CM | POA: Diagnosis not present

## 2012-10-30 DIAGNOSIS — M25569 Pain in unspecified knee: Secondary | ICD-10-CM | POA: Diagnosis not present

## 2012-11-01 DIAGNOSIS — M25569 Pain in unspecified knee: Secondary | ICD-10-CM | POA: Diagnosis not present

## 2012-11-06 DIAGNOSIS — M25569 Pain in unspecified knee: Secondary | ICD-10-CM | POA: Diagnosis not present

## 2012-11-08 DIAGNOSIS — M25569 Pain in unspecified knee: Secondary | ICD-10-CM | POA: Diagnosis not present

## 2013-02-20 ENCOUNTER — Encounter: Payer: Self-pay | Admitting: Family Medicine

## 2013-02-20 ENCOUNTER — Ambulatory Visit (INDEPENDENT_AMBULATORY_CARE_PROVIDER_SITE_OTHER): Payer: BC Managed Care – PPO | Admitting: Family Medicine

## 2013-02-20 ENCOUNTER — Encounter (INDEPENDENT_AMBULATORY_CARE_PROVIDER_SITE_OTHER): Payer: Self-pay

## 2013-02-20 VITALS — BP 140/80 | HR 91 | Resp 16 | Ht 59.0 in | Wt 163.8 lb

## 2013-02-20 DIAGNOSIS — M25569 Pain in unspecified knee: Secondary | ICD-10-CM | POA: Diagnosis not present

## 2013-02-20 DIAGNOSIS — E8881 Metabolic syndrome: Secondary | ICD-10-CM

## 2013-02-20 DIAGNOSIS — R7303 Prediabetes: Secondary | ICD-10-CM

## 2013-02-20 DIAGNOSIS — M25562 Pain in left knee: Secondary | ICD-10-CM

## 2013-02-20 DIAGNOSIS — R7309 Other abnormal glucose: Secondary | ICD-10-CM | POA: Diagnosis not present

## 2013-02-20 DIAGNOSIS — E669 Obesity, unspecified: Secondary | ICD-10-CM | POA: Diagnosis not present

## 2013-02-20 NOTE — Patient Instructions (Addendum)
Welcome to medicare in May, call if you need me before  Fasting lipid, and HBA1C first week in February  It is important that you exercise regularly at least 30 minutes 5 times a week. If you develop chest pain, have severe difficulty breathing, or feel very tired, stop exercising immediately and seek medical attention   Weight loss goal of 1.5 pounds per month

## 2013-02-20 NOTE — Assessment & Plan Note (Signed)
Improved

## 2013-02-20 NOTE — Assessment & Plan Note (Signed)
Deteriorated. Patient re-educated about  the importance of commitment to a  minimum of 150 minutes of exercise per week. The importance of healthy food choices with portion control discussed. Encouraged to start a food diary, count calories and to consider  joining a support group. Sample diet sheets offered. Goals set by the patient for the next several months.    

## 2013-02-20 NOTE — Assessment & Plan Note (Signed)
The increased risk of cardiovascular disease associated with this diagnosis, and the need to consistently work on lifestyle to change this is discussed. Following  a  heart healthy diet ,commitment to 30 minutes of exercise at least 5 days per week, as well as control of blood sugar and cholesterol , and achieving a healthy weight are all the areas to be addressed .  

## 2013-02-20 NOTE — Assessment & Plan Note (Signed)
Patient educated about the importance of limiting  Carbohydrate intake , the need to commit to daily physical activity for a minimum of 30 minutes , and to commit weight loss. The fact that changes in all these areas will reduce or eliminate all together the development of diabetes is stressed.    

## 2013-02-20 NOTE — Progress Notes (Signed)
   Subjective:    Patient ID: Amber Fernandez, female    DOB: 11/22/1945, 68 y.o.   MRN: [id number]  HPI The PT is here for follow up and re-evaluation of chronic medical conditions, medication management and review of any available recent lab and radiology data.  Preventive health is updated, specifically  Cancer screening and Immunization.    The PT denies any adverse reactions to current medications since the last visit.  There are no new concerns.  There are no specific complaints       Review of Systems See HPI Denies recent fever or chills. Denies sinus pressure, nasal congestion, ear pain or sore throat. Denies chest congestion, productive cough or wheezing. Denies chest pains, palpitations , pND or orthopnea. C/o intermittent leg swelling and requests compression hose with no toes Denies abdominal pain, nausea, vomiting,diarrhea or constipation.   Denies dysuria, frequency, hesitancy or incontinence. Denies j limitation in mobility.Intermittent left knee pain and swelling but not as severe or frequent Denies headaches, seizures, numbness, or tingling. Denies depression, anxiety or insomnia. Denies skin break down or rash.        Objective:   Physical Exam  Patient alert and oriented and in no cardiopulmonary distress.  HEENT: No facial asymmetry, EOMI, no sinus tenderness,  oropharynx pink and moist.  Neck supple no adenopathy.  Chest: Clear to auscultation bilaterally.  CVS: S1, S2 no murmurs, no S3.  ABD: Soft non tender. Bowel sounds normal.  Ext: No edema  MS: Adequate ROM spine, shoulders, hips and knees.  Skin: Intact, no ulcerations or rash noted.  Psych: Good eye contact, normal affect. Memory intact not anxious or depressed appearing.  CNS: CN 2-12 intact, power, tone and sensation normal throughout.       Assessment & Plan:

## 2013-03-05 DIAGNOSIS — R7309 Other abnormal glucose: Secondary | ICD-10-CM | POA: Diagnosis not present

## 2013-03-05 DIAGNOSIS — E669 Obesity, unspecified: Secondary | ICD-10-CM | POA: Diagnosis not present

## 2013-03-05 LAB — HEMOGLOBIN A1C
Hgb A1c MFr Bld: 6.3 % — ABNORMAL HIGH (ref <5.7)
Mean Plasma Glucose: 134 mg/dL — ABNORMAL HIGH (ref <117)

## 2013-03-06 LAB — LIPID PANEL
Cholesterol: 177 mg/dL (ref 0–200)
HDL: 74 mg/dL (ref >39)
LDL CALC: 90 mg/dL (ref 0–99)
TRIGLYCERIDES: 63 mg/dL (ref <150)
Total CHOL/HDL Ratio: 2.4 Ratio
VLDL: 13 mg/dL (ref 0–40)

## 2013-05-27 ENCOUNTER — Ambulatory Visit (INDEPENDENT_AMBULATORY_CARE_PROVIDER_SITE_OTHER): Payer: BC Managed Care – PPO | Admitting: Family Medicine

## 2013-05-27 ENCOUNTER — Encounter (INDEPENDENT_AMBULATORY_CARE_PROVIDER_SITE_OTHER): Payer: Self-pay

## 2013-05-27 ENCOUNTER — Encounter: Payer: Self-pay | Admitting: Family Medicine

## 2013-05-27 VITALS — BP 140/84 | HR 92 | Resp 18 | Wt 161.1 lb

## 2013-05-27 DIAGNOSIS — E8881 Metabolic syndrome: Secondary | ICD-10-CM

## 2013-05-27 DIAGNOSIS — R030 Elevated blood-pressure reading, without diagnosis of hypertension: Secondary | ICD-10-CM

## 2013-05-27 DIAGNOSIS — M7989 Other specified soft tissue disorders: Secondary | ICD-10-CM | POA: Insufficient documentation

## 2013-05-27 DIAGNOSIS — R7303 Prediabetes: Secondary | ICD-10-CM

## 2013-05-27 DIAGNOSIS — IMO0001 Reserved for inherently not codable concepts without codable children: Secondary | ICD-10-CM | POA: Insufficient documentation

## 2013-05-27 DIAGNOSIS — Z1211 Encounter for screening for malignant neoplasm of colon: Secondary | ICD-10-CM | POA: Diagnosis not present

## 2013-05-27 DIAGNOSIS — Z0000 Encounter for general adult medical examination without abnormal findings: Secondary | ICD-10-CM

## 2013-05-27 DIAGNOSIS — R7309 Other abnormal glucose: Secondary | ICD-10-CM

## 2013-05-27 DIAGNOSIS — Z1239 Encounter for other screening for malignant neoplasm of breast: Secondary | ICD-10-CM | POA: Diagnosis not present

## 2013-05-27 LAB — HEMOCCULT GUIAC POC 1CARD (OFFICE): Fecal Occult Blood, POC: NEGATIVE

## 2013-05-27 MED ORDER — CLOTRIMAZOLE-BETAMETHASONE 1-0.05 % EX CREA: TOPICAL_CREAM | Freq: Two times a day (BID) | CUTANEOUS | Status: DC

## 2013-05-27 NOTE — Progress Notes (Signed)
   Subjective:    Patient ID: Amber Fernandez, female    DOB: 08/10/1945, 68 y.o.   MRN: [id number]  HPI Patient is in for annual exam Health maintainance is reviewed and updated, specifically screening tests and recommended immunizations. Recent lab and radiologic data, since previous visit is also reviewed with the patient. Healthy lifestyle as far as commitment to regular physical activity, heart healthy diet , safe habits, as far as seat belt and helmet use, , a The importance of adequate rest is also discussed.       Review of Systems See HPI Denies recent fever or chills. Denies sinus pressure, nasal congestion, ear pain or sore throat. Denies chest congestion, productive cough or wheezing. 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   Denies dysuria, frequency, hesitancy or incontinence. Denies joint pain, swelling and limitation in mobility. Denies headaches, seizures, numbness, or tingling. 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 Afebrile. HEENT No facial trauma or asymetry. Sinuses non tender.  EOMI, PERTL, fundoscopic exam , no hemorhage or exudate.  External ears normal, tympanic membranes clear. Oropharynx moist, no exudate, good dentition. Neck: supple, no adenopathy,JVD or thyromegaly.No bruits.  Chest: Clear to ascultation bilaterally.No crackles or wheezes. Non tender to palpation  Breast: No  asymetry,no masses. No nipple discharge or inversion. No axillary or supraclavicular adenopathy  Cardiovascular system; Heart sounds normal,  S1 and  S2 ,no S3.  No murmur, or thrill. Apical beat not displaced Peripheral pulses normal.  Abdomen: Soft, non tender, no organomegaly or masses. No bruits. Bowel sounds normal. No guarding, tenderness or rebound.  Rectal:  No mass. Guaiac negative stool.  GU: External genitalia normal. No lesions. Vaginal canal normal.Physiologic  discharge. Uterus normal size, no adnexal masses, no cervical motion or adnexal tenderness.  Musculoskeletal exam: Full ROM of spine, hips , shoulders and knees. No deformity ,swelling or crepitus noted. No muscle wasting or atrophy.   Neurologic: Cranial nerves 2 to 12 intact. Power, tone ,sensation and reflexes normal throughout. No disturbance in gait. No tremor.  Skin: Intact, no ulceration, erythema , scaling or rash noted. Pigmentation normal throughout  Psych; Normal mood and affect. Judgement and concentration normal        Assessment & Plan:  Routine general medical examination at a health care facility Annual exam as documented. Counseling done  re healthy lifestyle involving commitment to 150 minutes exercise per week, heart healthy diet, and attaining healthy weight.The importance of adequate sleep also discussed. Regular seat belt use and safe storage  of firearms if patient has them, is also discussed. Changes in health habits are decided on by the patient with goals and time frames  set for achieving them. Immunization and cancer screening needs are specifically addressed at this visit.   Leg swelling C/o recurrent leg swelling after standing for prolonged periods, requests compression hose with open toe, script provided for this

## 2013-05-27 NOTE — Assessment & Plan Note (Signed)
Annual exam as documented. Counseling done  re healthy lifestyle involving commitment to 150 minutes exercise per week, heart healthy diet, and attaining healthy weight.The importance of adequate sleep also discussed. Regular seat belt use and safe storage  of firearms if patient has them, is also discussed. Changes in health habits are decided on by the patient with goals and time frames  set for achieving them. Immunization and cancer screening needs are specifically addressed at this visit.  

## 2013-05-27 NOTE — Assessment & Plan Note (Signed)
C/o recurrent leg swelling after standing for prolonged periods, requests compression hose with open toe, script provided for this

## 2013-05-27 NOTE — Patient Instructions (Addendum)
F/u in 4. months, call if you need me before  Please work on daily physical activity and weight loss.  Congrats on good cholesterol  Blood sugar has increased so make needed changes  You are referred for a mammogram and a colonoscopy  HBa1C in Septemeber ,, chem 7  non fast,CBC, and TSH in Septmeber before visit  Weight loss goal of 5 pounds  Blood pressure is elevated today, please reduce salt intake including canned foods, eayt mainly fresh/fdrozen fruit and vegetable, need to lose weight. If blood pressure high on return you will need to start medication for this  Script given to you in office for hose requested

## 2013-05-28 ENCOUNTER — Other Ambulatory Visit: Payer: Self-pay | Admitting: Family Medicine

## 2013-05-28 DIAGNOSIS — Z1231 Encounter for screening mammogram for malignant neoplasm of breast: Secondary | ICD-10-CM

## 2013-05-31 ENCOUNTER — Ambulatory Visit (HOSPITAL_COMMUNITY)
Admission: RE | Admit: 2013-05-31 | Discharge: 2013-05-31 | Disposition: A | Discharge status: Home / Self Care | Payer: BC Managed Care – PPO | Source: Ambulatory Visit | Attending: Family Medicine | Admitting: Family Medicine

## 2013-05-31 DIAGNOSIS — Z1231 Encounter for screening mammogram for malignant neoplasm of breast: Secondary | ICD-10-CM | POA: Insufficient documentation

## 2013-06-20 ENCOUNTER — Other Ambulatory Visit: Payer: Self-pay

## 2013-06-20 ENCOUNTER — Telehealth: Payer: Self-pay

## 2013-06-20 ENCOUNTER — Encounter (HOSPITAL_COMMUNITY): Payer: Self-pay | Admitting: Pharmacy Technician

## 2013-06-20 ENCOUNTER — Telehealth: Payer: Self-pay | Admitting: General Practice

## 2013-06-20 DIAGNOSIS — Z1211 Encounter for screening for malignant neoplasm of colon: Secondary | ICD-10-CM

## 2013-06-20 NOTE — Telephone Encounter (Signed)
Gastroenterology Pre-Procedure Review  Request Date: 06/20/2013 Requesting Physician: Dr. Lodema HongSimpson  PATIENT REVIEW QUESTIONS: The patient responded to the following health history questions as indicated:    1. Diabetes Melitis: no 2. Joint replacements in the past 12 months: no 3. Major health problems in the past 3 months: no 4. Has an artificial valve or MVP: no 5. Has a defibrillator: no 6. Has been advised in past to take antibiotics in advance of a procedure like teeth cleaning: no    MEDICATIONS & ALLERGIES:    Patient reports the following regarding taking any blood thinners:   Plavix? no Aspirin? YES Coumadin? no  Patient confirms/reports the following medications:  Current Outpatient Prescriptions  Medication Sig Dispense Refill  . aspirin 81 MG tablet Take 81 mg by mouth daily.        . Calcium Carbonate-Vit D-Min (CALCIUM 1200) 1200-1000 MG-UNIT CHEW Chew 1 tablet by mouth daily.      . clotrimazole-betamethasone (LOTRISONE) cream Apply topically 2 (two) times daily.  45 g  2  . ferrous sulfate 325 (65 FE) MG tablet Take 325 mg by mouth daily with breakfast.        . Multiple Vitamin (MULTIVITAMIN) tablet Take 1 tablet by mouth daily.       No current facility-administered medications for this visit.    Patient confirms/reports the following allergies:  Allergies  Allergen Reactions  . Codeine     No orders of the defined types were placed in this encounter.    AUTHORIZATION INFORMATION Primary Insurance:   ID #:   Group #:  Pre-Cert / Auth required:  Pre-Cert / Auth #:   Secondary Insurance:   ID #:  Group #:  Pre-Cert / Auth required: Pre-Cert / Auth #:   SCHEDULE INFORMATION: Procedure has been scheduled as follows:  Date: 07/05/2013                  Time: 11:30 AM  Location:[facility]   This Gastroenterology Pre-Precedure Review Form is being routed to the following provider(s): Jonette EvaSandi Fields, MD

## 2013-06-20 NOTE — Telephone Encounter (Signed)
LMOM to call.

## 2013-06-20 NOTE — Telephone Encounter (Signed)
Patient called to setup her colonoscopy.  She was referred by Dr. Lodema HongSimpson.

## 2013-06-20 NOTE — Telephone Encounter (Signed)
HOLD IRON FOR 7 DAYS. MOVI PREP SPLIT DOSING-FULL LIQUIDS WITH BREAKFAST.

## 2013-06-21 MED ORDER — PEG-KCL-NACL-NASULF-NA ASC-C 100 G PO SOLR: 1.0000 | ORAL | Status: DC

## 2013-06-21 NOTE — Telephone Encounter (Signed)
Rx sent to the pharmacy and instructions mailed to pt.   I called pt and reminded her to hold iron for 7 days prior to procedure.

## 2013-07-05 ENCOUNTER — Encounter (HOSPITAL_COMMUNITY): Payer: Self-pay

## 2013-07-05 ENCOUNTER — Encounter (HOSPITAL_COMMUNITY)
Admission: RE | Disposition: A | Discharge status: Home / Self Care | Payer: Self-pay | Source: Ambulatory Visit | Attending: Gastroenterology

## 2013-07-05 ENCOUNTER — Ambulatory Visit (HOSPITAL_COMMUNITY)
Admission: RE | Admit: 2013-07-05 | Discharge: 2013-07-05 | Disposition: A | Discharge status: Home / Self Care | Payer: BC Managed Care – PPO | Source: Ambulatory Visit | Attending: Gastroenterology | Admitting: Gastroenterology

## 2013-07-05 DIAGNOSIS — Z1211 Encounter for screening for malignant neoplasm of colon: Secondary | ICD-10-CM | POA: Insufficient documentation

## 2013-07-05 DIAGNOSIS — F29 Unspecified psychosis not due to a substance or known physiological condition: Secondary | ICD-10-CM | POA: Insufficient documentation

## 2013-07-05 DIAGNOSIS — F329 Major depressive disorder, single episode, unspecified: Secondary | ICD-10-CM | POA: Insufficient documentation

## 2013-07-05 DIAGNOSIS — Z7982 Long term (current) use of aspirin: Secondary | ICD-10-CM | POA: Insufficient documentation

## 2013-07-05 DIAGNOSIS — K648 Other hemorrhoids: Secondary | ICD-10-CM | POA: Insufficient documentation

## 2013-07-05 DIAGNOSIS — Z885 Allergy status to narcotic agent status: Secondary | ICD-10-CM | POA: Insufficient documentation

## 2013-07-05 DIAGNOSIS — Z79899 Other long term (current) drug therapy: Secondary | ICD-10-CM | POA: Insufficient documentation

## 2013-07-05 DIAGNOSIS — F3289 Other specified depressive episodes: Secondary | ICD-10-CM | POA: Insufficient documentation

## 2013-07-05 HISTORY — PX: COLONOSCOPY: SHX5424

## 2013-07-05 LAB — CBC
HEMATOCRIT: 31 % — AB (ref 36.0–46.0)
HEMOGLOBIN: 9.8 g/dL — AB (ref 12.0–15.0)
MCH: 24.1 pg — ABNORMAL LOW (ref 26.0–34.0)
MCHC: 31.6 g/dL (ref 30.0–36.0)
MCV: 76.4 fL — AB (ref 78.0–100.0)
Platelets: 196 10*3/uL (ref 150–400)
RBC: 4.06 MIL/uL (ref 3.87–5.11)
RDW: 16 % — AB (ref 11.5–15.5)
WBC: 3.2 10*3/uL — AB (ref 4.0–10.5)

## 2013-07-05 SURGERY — COLONOSCOPY
Anesthesia: Moderate Sedation

## 2013-07-05 MED ORDER — STERILE WATER FOR IRRIGATION IR SOLN
Status: DC | PRN
  Administered 2013-07-05: 12:00:00

## 2013-07-05 MED ORDER — MEPERIDINE HCL 100 MG/ML IJ SOLN
INTRAMUSCULAR | Status: AC
  Filled 2013-07-05: qty 2

## 2013-07-05 MED ORDER — MEPERIDINE HCL 100 MG/ML IJ SOLN
INTRAMUSCULAR | Status: DC | PRN
  Administered 2013-07-05 (×2): 25 mg via INTRAVENOUS

## 2013-07-05 MED ORDER — SODIUM CHLORIDE 0.9 % IV SOLN
INTRAVENOUS | Status: DC
  Administered 2013-07-05: 11:00:00 via INTRAVENOUS

## 2013-07-05 MED ORDER — MIDAZOLAM HCL 5 MG/5ML IJ SOLN
INTRAMUSCULAR | Status: AC
  Filled 2013-07-05: qty 10

## 2013-07-05 MED ORDER — MIDAZOLAM HCL 5 MG/5ML IJ SOLN
INTRAMUSCULAR | Status: DC | PRN
  Administered 2013-07-05: 2 mg via INTRAVENOUS
  Administered 2013-07-05: 1 mg via INTRAVENOUS
  Administered 2013-07-05: 2 mg via INTRAVENOUS

## 2013-07-05 NOTE — Discharge Instructions (Signed)
You have internal hemorrhoids. YOU DID NOT HAVE ANY POLYPS. ° ° °FOLLOW A HIGH FIBER DIET. AVOID ITEMS THAT CAUSE BLOATING. SEE INFO BELOW. ° °Next colonoscopy in 10 years. ° ° °Colonoscopy °Care After °Read the instructions outlined below and refer to this sheet in the next week. These discharge instructions provide you with general information on caring for yourself after you leave the hospital. While your treatment has been planned according to the most current medical practices available, unavoidable complications occasionally occur. If you have any problems or questions after discharge, call DR. Beva Remund, [phone]. ° °ACTIVITY °· You may resume your regular activity, but move at a slower pace for the next 24 hours.  °· Take frequent rest periods for the next 24 hours.  °· Walking will help get rid of the air and reduce the bloated feeling in your belly (abdomen).  °· No driving for 24 hours (because of the medicine (anesthesia) used during the test).  °· You may shower.  °· Do not sign any important legal documents or operate any machinery for 24 hours (because of the anesthesia used during the test).  °·  °NUTRITION °· Drink plenty of fluids.  °· You may resume your normal diet as instructed by your doctor.  °· Begin with a light meal and progress to your normal diet. Heavy or fried foods are harder to digest and may make you feel sick to your stomach (nauseated).  °· Avoid alcoholic beverages for 24 hours or as instructed.  °·  °MEDICATIONS °· You may resume your normal medications. °·  °WHAT YOU CAN EXPECT TODAY °· Some feelings of bloating in the abdomen.  °· Passage of more gas than usual.  °· Spotting of blood in your stool or on the toilet paper °· .  °IF YOU HAD POLYPS REMOVED DURING THE COLONOSCOPY: °· Eat a soft diet IF YOU HAVE NAUSEA, BLOATING, ABDOMINAL PAIN, OR VOMITING. °·   °FINDING OUT THE RESULTS OF YOUR TEST °Not all test results are available during your visit. DR. Jie Stickels WILL CALL YOU  WITHIN 7 DAYS OF YOUR PROCEDUE WITH YOUR RESULTS. Do not assume everything is normal if you have not heard from DR. Ceasia Elwell IN ONE WEEK, CALL HER OFFICE AT [phone]. ° °SEEK IMMEDIATE MEDICAL ATTENTION AND CALL THE OFFICE: [phone] IF: °· You have more than a spotting of blood in your stool.  °· Your belly is swollen (abdominal distention).  °· You are nauseated or vomiting.  °· You have a temperature over 101F.  °· You have abdominal pain or discomfort that is severe or gets worse throughout the day. ° °High-Fiber Diet °A high-fiber diet changes your normal diet to include more whole grains, legumes, fruits, and vegetables. Changes in the diet involve replacing refined carbohydrates with unrefined foods. The calorie level of the diet is essentially unchanged. The Dietary Reference Intake (recommended amount) for adult males is 38 grams per day. For adult females, it is 25 grams per day. Pregnant and lactating women should consume 28 grams of fiber per day. °Fiber is the intact part of a plant that is not broken down during digestion. Functional fiber is fiber that has been isolated from the plant to provide a beneficial effect in the body. °PURPOSE °· Increase stool bulk.  °· Ease and regulate bowel movements.  °· Lower cholesterol.  °INDICATIONS THAT YOU NEED MORE FIBER °· Constipation and hemorrhoids.  °· Uncomplicated diverticulosis (intestine condition) and irritable bowel syndrome.  °· Weight management.  °· As   a protective measure against hardening of the arteries (atherosclerosis), diabetes, and cancer.  ° °GUIDELINES FOR INCREASING FIBER IN THE DIET °· Start adding fiber to the diet slowly. A gradual increase of about 5 more grams (2 slices of whole-wheat bread, 2 servings of most fruits or vegetables, or 1 bowl of high-fiber cereal) per day is best. Too rapid an increase in fiber may result in constipation, flatulence, and bloating.  °· Drink enough water and fluids to keep your urine clear or pale  yellow. Water, juice, or caffeine-free drinks are recommended. Not drinking enough fluid may cause constipation.  °· Eat a variety of high-fiber foods rather than one type of fiber.  °· Try to increase your intake of fiber through using high-fiber foods rather than fiber pills or supplements that contain small amounts of fiber.  °· The goal is to change the types of food eaten. Do not supplement your present diet with high-fiber foods, but replace foods in your present diet.  °INCLUDE A VARIETY OF FIBER SOURCES °· Replace refined and processed grains with whole grains, canned fruits with fresh fruits, and incorporate other fiber sources. White rice, white breads, and most bakery goods contain little or no fiber.  °· Brown whole-grain rice, buckwheat oats, and many fruits and vegetables are all good sources of fiber. These include: broccoli, Brussels sprouts, cabbage, cauliflower, beets, sweet potatoes, white potatoes (skin on), carrots, tomatoes, eggplant, squash, berries, fresh fruits, and dried fruits.  °· Cereals appear to be the richest source of fiber. Cereal fiber is found in whole grains and bran. Bran is the fiber-rich outer coat of cereal grain, which is largely removed in refining. In whole-grain cereals, the bran remains. In breakfast cereals, the largest amount of fiber is found in those with "bran" in their names. The fiber content is sometimes indicated on the label.  °· You may need to include additional fruits and vegetables each day.  °· In baking, for 1 cup white flour, you may use the following substitutions:  °· 1 cup whole-wheat flour minus 2 tablespoons.  °· 1/2 cup white flour plus 1/2 cup whole-wheat flour.  ° °Hemorrhoids °Hemorrhoids are dilated (enlarged) veins around the rectum. Sometimes clots will form in the veins. This makes them swollen and painful. These are called thrombosed hemorrhoids. °Causes of hemorrhoids include: °· Constipation.  °· Straining to have a bowel movement. °·   HEAVY LIFTING °HOME CARE INSTRUCTIONS °· Eat a well balanced diet and drink 6 to 8 glasses of water every day to avoid constipation. You may also use a bulk laxative.  °· Avoid straining to have bowel movements.  °· Keep anal area dry and clean.  °· Do not use a donut shaped pillow or sit on the toilet for long periods. This increases blood pooling and pain.  °· Move your bowels when your body has the urge; this will require less straining and will decrease pain and pressure.  ° °
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
## 2013-07-05 NOTE — Op Note (Signed)
[facility] [address] [place], [address]   COLONOSCOPY PROCEDURE REPORT  PATIENT: Amber Fernandez, Amber Fernandez  MR#: [id number] BIRTHDATE: 1945/11/08 , 68  yrs. old GENDER: Female ENDOSCOPIST: Jonette Eva, MD REFERRED AS:UORVIFBP Lodema Hong, M.D. PROCEDURE DATE:  07/05/2013 PROCEDURE:   Colonoscopy, screening INDICATIONS:Average risk patient for colon cancer. MEDICATIONS: Demerol 50 mg IV and Versed 6 mg IV  DESCRIPTION OF PROCEDURE:    Physical exam was performed.  Informed consent was obtained from the patient after explaining the benefits, risks, and alternatives to procedure.  The patient was connected to monitor and placed in left lateral position. Continuous oxygen was provided by nasal cannula and IV medicine administered through an indwelling cannula.  After administration of sedation and rectal exam, the patients rectum was intubated and the EC-3890Li ([id number])  colonoscope was advanced under direct visualization to the ileum.  The scope was removed slowly by carefully examining the color, texture, anatomy, and integrity mucosa on the way out.  The patient was recovered in endoscopy and discharged home in satisfactory condition.    COLON FINDINGS: The mucosa appeared normal in the terminal ileum.  , A normal appearing cecum, ileocecal valve, and appendiceal orifice were identified.  The ascending, hepatic flexure, transverse, splenic flexure, descending, sigmoid colon and rectum appeared unremarkable.  No polyps or cancers were seen.  , and Small internal hemorrhoids were found.  PREP QUALITY: excellent.  CECAL W/D TIME: 10 minutes COMPLICATIONS: None  ENDOSCOPIC IMPRESSION: 1.   Normal mucosa in the terminal ileum 2.   Normal colon 3.   Small internal hemorrhoids  RECOMMENDATIONS: HIGH FIBER DIET TCS IN 10 YEARS       _______________________________ eSignedJonette Eva, MD 07/05/2013 12:28 PM

## 2013-07-09 ENCOUNTER — Encounter (HOSPITAL_COMMUNITY): Payer: Self-pay | Admitting: Gastroenterology

## 2013-08-29 LAB — CBC
HEMATOCRIT: 35 % — AB (ref 36.0–46.0)
Hemoglobin: 11.4 g/dL — ABNORMAL LOW (ref 12.0–15.0)
MCH: 23.6 pg — ABNORMAL LOW (ref 26.0–34.0)
MCHC: 32.6 g/dL (ref 30.0–36.0)
MCV: 72.5 fL — ABNORMAL LOW (ref 78.0–100.0)
Platelets: 218 10*3/uL (ref 150–400)
RBC: 4.83 MIL/uL (ref 3.87–5.11)
RDW: 18.8 % — AB (ref 11.5–15.5)
WBC: 3.5 10*3/uL — ABNORMAL LOW (ref 4.0–10.5)

## 2013-08-29 LAB — BASIC METABOLIC PANEL
BUN: 12 mg/dL (ref 6–23)
CO2: 26 mEq/L (ref 19–32)
Calcium: 9.1 mg/dL (ref 8.4–10.5)
Chloride: 107 mEq/L (ref 96–112)
Creat: 0.88 mg/dL (ref 0.50–1.10)
Glucose, Bld: 134 mg/dL — ABNORMAL HIGH (ref 70–99)
POTASSIUM: 4.2 meq/L (ref 3.5–5.3)
Sodium: 140 mEq/L (ref 135–145)

## 2013-08-29 LAB — HEMOGLOBIN A1C
HEMOGLOBIN A1C: 6 % — AB (ref <5.7)
MEAN PLASMA GLUCOSE: 126 mg/dL — AB (ref <117)

## 2013-08-30 LAB — TSH: TSH: 1.987 u[IU]/mL (ref 0.350–4.500)

## 2013-09-26 ENCOUNTER — Ambulatory Visit (INDEPENDENT_AMBULATORY_CARE_PROVIDER_SITE_OTHER): Payer: BC Managed Care – PPO | Admitting: Family Medicine

## 2013-09-26 ENCOUNTER — Encounter: Payer: Self-pay | Admitting: Family Medicine

## 2013-09-26 ENCOUNTER — Encounter (INDEPENDENT_AMBULATORY_CARE_PROVIDER_SITE_OTHER): Payer: Self-pay

## 2013-09-26 VITALS — BP 130/80 | HR 82 | Resp 16 | Ht 59.0 in | Wt 157.4 lb

## 2013-09-26 DIAGNOSIS — IMO0001 Reserved for inherently not codable concepts without codable children: Secondary | ICD-10-CM

## 2013-09-26 DIAGNOSIS — M7989 Other specified soft tissue disorders: Secondary | ICD-10-CM | POA: Diagnosis not present

## 2013-09-26 DIAGNOSIS — R7309 Other abnormal glucose: Secondary | ICD-10-CM

## 2013-09-26 DIAGNOSIS — Z13220 Encounter for screening for lipoid disorders: Secondary | ICD-10-CM

## 2013-09-26 DIAGNOSIS — R030 Elevated blood-pressure reading, without diagnosis of hypertension: Secondary | ICD-10-CM | POA: Diagnosis not present

## 2013-09-26 DIAGNOSIS — E669 Obesity, unspecified: Secondary | ICD-10-CM

## 2013-09-26 DIAGNOSIS — R7303 Prediabetes: Secondary | ICD-10-CM

## 2013-09-26 NOTE — Patient Instructions (Addendum)
Annual physical exam end April, call if you need me before  Blood sugar has improved , which is good  Continue to work on low sugar eating and regular exercise to improve blood sugar and promote weight loss.  Reconsider flu vaccine  Fasting lipid, chem 7, hBA1C, cBC in end April before you return.  New script for leg swelling today for pantyhose

## 2013-09-29 NOTE — Assessment & Plan Note (Signed)
Ongoing problem esp at end of workday after standing for 8 hrs. Request for "popen toe" pantyhose with compression of 16 to 20 mm HG written Pt returned previous script for "thighj high" states she is unable to se as it presses on her thigh

## 2013-09-29 NOTE — Assessment & Plan Note (Signed)
Improved Patient educated about the importance of limiting  Carbohydrate intake , the need to commit to daily physical activity for a minimum of 30 minutes , and to commit weight loss. The fact that changes in all these areas will reduce or eliminate all together the development of diabetes is stressed.    

## 2013-09-29 NOTE — Assessment & Plan Note (Signed)
Normal at this visit with lifestyle change this has improved DASH diet and commitment to daily physical activity for a minimum of 30 minutes discussed and encouraged, as a part of hypertension management. The importance of attaining a healthy weight is also discussed.

## 2013-09-29 NOTE — Progress Notes (Signed)
   Subjective:    Patient ID: Amber Fernandez, female    DOB: 1946/01/08, 68 y.o.   MRN: [id number]  HPI The PT is here for follow up and re-evaluation of chronic medical conditions, medication management and review of any available recent lab and radiology data.  Preventive health is updated, specifically  Cancer screening and Immunization.  Refusing flu vaccine today Questions or concerns regarding consultations or procedures which the PT has had in the interim are  addressed.      Review of Systems See HPI Denies recent fever or chills. Denies sinus pressure, nasal congestion, ear pain or sore throat. Denies chest congestion, productive cough or wheezing. Denies chest pains, palpitations , PND or orthopnea Ongoing c/o leg swelling adter standing on the job Denies abdominal pain, nausea, vomiting,diarrhea or constipation.   Denies dysuria, frequency, hesitancy or incontinence. Denies joint pain, swelling and limitation in mobility. Denies headaches, seizures, numbness, or tingling. Denies depression, anxiety or insomnia. Denies skin break down or rash.        Objective:   Physical Exam BP 130/80  Pulse 82  Resp 16  Ht  (1.499 m)  Wt 157 lb 6.4 oz (71.396 kg)  BMI 31.77 kg/m2  SpO2 98% Patient alert and oriented and in no cardiopulmonary distress.  HEENT: No facial asymmetry, EOMI,   oropharynx pink and moist.  Neck supple no JVD, no mass.  Chest: Clear to auscultation bilaterally.  CVS: S1, S2 no murmurs, no S3.Regular rate.  ABD: Soft non tender.   Ext: One plus edema  MS: Adequate ROM spine, shoulders, hips and knees.  Skin: Intact, no ulcerations or rash noted.  Psych: Good eye contact, normal affect. Memory intact not anxious or depressed appearing.  CNS: CN 2-12 intact, power,  normal throughout.no focal deficits noted.        Assessment & Plan:  Prediabetes Improved Patient educated about the importance of limiting  Carbohydrate intake ,  the need to commit to daily physical activity for a minimum of 30 minutes , and to commit weight loss. The fact that changes in all these areas will reduce or eliminate all together the development of diabetes is stressed.     Elevated blood pressure Normal at this visit with lifestyle change this has improved DASH diet and commitment to daily physical activity for a minimum of 30 minutes discussed and encouraged, as a part of hypertension management. The importance of attaining a healthy weight is also discussed.   Leg swelling Ongoing problem esp at end of workday after standing for 8 hrs. Request for "popen toe" pantyhose with compression of 16 to 20 mm HG written Pt returned previous script for "thighj high" states she is unable to se as it presses on her thigh  OBESITY Improved. Pt applauded on succesful weight loss through lifestyle change, and encouraged to continue same. Weight loss goal set for the next several months.

## 2013-09-29 NOTE — Assessment & Plan Note (Signed)
Improved. Pt applauded on succesful weight loss through lifestyle change, and encouraged to continue same. Weight loss goal set for the next several months.  

## 2014-03-18 ENCOUNTER — Other Ambulatory Visit: Payer: Self-pay

## 2014-03-18 MED ORDER — CLOTRIMAZOLE-BETAMETHASONE 1-0.05 % EX CREA: TOPICAL_CREAM | Freq: Two times a day (BID) | CUTANEOUS | Status: DC

## 2014-04-29 ENCOUNTER — Other Ambulatory Visit: Payer: Self-pay | Admitting: Family Medicine

## 2014-04-29 DIAGNOSIS — Z1231 Encounter for screening mammogram for malignant neoplasm of breast: Secondary | ICD-10-CM

## 2014-05-14 ENCOUNTER — Encounter: Payer: Self-pay | Admitting: Family Medicine

## 2014-05-14 ENCOUNTER — Ambulatory Visit (INDEPENDENT_AMBULATORY_CARE_PROVIDER_SITE_OTHER): Payer: BLUE CROSS/BLUE SHIELD | Admitting: Family Medicine

## 2014-05-14 VITALS — BP 142/84 | HR 85 | Resp 16 | Ht 60.5 in | Wt 161.0 lb

## 2014-05-14 DIAGNOSIS — Z23 Encounter for immunization: Secondary | ICD-10-CM | POA: Diagnosis not present

## 2014-05-14 DIAGNOSIS — IMO0001 Reserved for inherently not codable concepts without codable children: Secondary | ICD-10-CM

## 2014-05-14 DIAGNOSIS — R030 Elevated blood-pressure reading, without diagnosis of hypertension: Secondary | ICD-10-CM

## 2014-05-14 DIAGNOSIS — Z13220 Encounter for screening for lipoid disorders: Secondary | ICD-10-CM

## 2014-05-14 DIAGNOSIS — Z0000 Encounter for general adult medical examination without abnormal findings: Secondary | ICD-10-CM | POA: Insufficient documentation

## 2014-05-14 DIAGNOSIS — R7303 Prediabetes: Secondary | ICD-10-CM

## 2014-05-14 DIAGNOSIS — E8881 Metabolic syndrome: Secondary | ICD-10-CM

## 2014-05-14 DIAGNOSIS — R7309 Other abnormal glucose: Secondary | ICD-10-CM

## 2014-05-14 DIAGNOSIS — D649 Anemia, unspecified: Secondary | ICD-10-CM

## 2014-05-14 DIAGNOSIS — E559 Vitamin D deficiency, unspecified: Secondary | ICD-10-CM

## 2014-05-14 LAB — CBC WITH DIFFERENTIAL/PLATELET
BASOS PCT: 1 % (ref 0–1)
Basophils Absolute: 0 10*3/uL (ref 0.0–0.1)
EOS ABS: 0.2 10*3/uL (ref 0.0–0.7)
Eosinophils Relative: 5 % (ref 0–5)
HEMATOCRIT: 40.1 % (ref 36.0–46.0)
HEMOGLOBIN: 13.1 g/dL (ref 12.0–15.0)
Lymphocytes Relative: 46 % (ref 12–46)
Lymphs Abs: 1.8 10*3/uL (ref 0.7–4.0)
MCH: 25.8 pg — AB (ref 26.0–34.0)
MCHC: 32.7 g/dL (ref 30.0–36.0)
MCV: 78.9 fL (ref 78.0–100.0)
MONO ABS: 0.3 10*3/uL (ref 0.1–1.0)
MONOS PCT: 8 % (ref 3–12)
MPV: 9.3 fL (ref 8.6–12.4)
NEUTROS ABS: 1.6 10*3/uL — AB (ref 1.7–7.7)
NEUTROS PCT: 40 % — AB (ref 43–77)
PLATELETS: 211 10*3/uL (ref 150–400)
RBC: 5.08 MIL/uL (ref 3.87–5.11)
RDW: 14.7 % (ref 11.5–15.5)
WBC: 3.9 10*3/uL — ABNORMAL LOW (ref 4.0–10.5)

## 2014-05-14 LAB — COMPREHENSIVE METABOLIC PANEL
ALK PHOS: 70 U/L (ref 39–117)
ALT: 16 U/L (ref 0–35)
AST: 20 U/L (ref 0–37)
Albumin: 3.7 g/dL (ref 3.5–5.2)
BILIRUBIN TOTAL: 0.5 mg/dL (ref 0.2–1.2)
BUN: 14 mg/dL (ref 6–23)
CO2: 29 mEq/L (ref 19–32)
Calcium: 9.3 mg/dL (ref 8.4–10.5)
Chloride: 105 mEq/L (ref 96–112)
Creat: 0.8 mg/dL (ref 0.50–1.10)
GLUCOSE: 90 mg/dL (ref 70–99)
POTASSIUM: 4.8 meq/L (ref 3.5–5.3)
Sodium: 140 mEq/L (ref 135–145)
Total Protein: 7 g/dL (ref 6.0–8.3)

## 2014-05-14 LAB — LIPID PANEL
CHOLESTEROL: 185 mg/dL (ref 0–200)
HDL: 75 mg/dL (ref >=46)
LDL Cholesterol: 95 mg/dL (ref 0–99)
Total CHOL/HDL Ratio: 2.5 Ratio
Triglycerides: 75 mg/dL (ref <150)
VLDL: 15 mg/dL (ref 0–40)

## 2014-05-14 LAB — TSH: TSH: 2.008 u[IU]/mL (ref 0.350–4.500)

## 2014-05-14 MED ORDER — CLOTRIMAZOLE-BETAMETHASONE 1-0.05 % EX CREA: TOPICAL_CREAM | Freq: Two times a day (BID) | CUTANEOUS | Status: DC

## 2014-05-14 NOTE — Assessment & Plan Note (Signed)
After obtaining informed consent, the vaccine is  administered by LPN.  

## 2014-05-14 NOTE — Patient Instructions (Signed)
Annual physical exam in 3 month, call if you need me before  Labs today  Prevnar today   Keep active and eat more fresh fruit and vegetables, blood pressure elevated today, work on stress relief  Thanks for choosing [facility], we consider it a privelige to serve you.

## 2014-05-14 NOTE — Assessment & Plan Note (Signed)

## 2014-05-14 NOTE — Progress Notes (Signed)
Subjective:    Patient ID: Amber Fernandez, female    DOB: 1945-08-24, 69 y.o.   MRN: [id number]  HPI  Preventive Screening-Counseling & Management   Patient present here today for an initial  Medicare annual wellness visit.   Current Problems (verified)   Medications Prior to Visit Allergies (verified)   PAST HISTORY  Family History (verified)   Social History Widow since 2003, works at [employer]    Risk Factors  Current exercise habits:  Walk all day at home- cares for her sick mother who can't be left alone   Dietary issues discussed: Heart healthy diet discussed , eating more fruits/vegatables and limiting fried fatty foods    Cardiac risk factors: brother had MI at age 48   Depression Screen  (Note: if answer to either of the following is "Yes", a more complete depression screening is indicated)   Over the past two weeks, have you felt down, depressed or hopeless? Yes  Over the past two weeks, have you felt little interest or pleasure in doing things? No  Have you lost interest or pleasure in daily life? No  Do you often feel hopeless? No  Do you cry easily over simple problems? No   Activities of Daily Living  In your present state of health, do you have any difficulty performing the following activities?  Driving?: No Managing money?: No Feeding yourself?:No Getting from bed to chair?:No Climbing a flight of stairs?: can't really due to knee pain/problems  Preparing food and eating?:No Bathing or showering?:No Getting dressed?:No Getting to the toilet?:No Using the toilet?:No Moving around from place to place?: No  Fall Risk Assessment In the past year have you fallen or had a near fall?:No Are you currently taking any medications that make you dizzyNo   Hearing Difficulties: No Do you often ask people to speak up or repeat themselves?:No Do you experience ringing or noises in your ears?:No Do you have difficulty understanding soft or whispered  voices?:No  Cognitive Testing  Alert? Yes Normal Appearance?Yes  Oriented to person? Yes Place? Yes  Time? Yes  Displays appropriate judgment?Yes  Can read the correct time from a watch face? yes Are you having problems remembering things?No  Advanced Directives have been discussed with the patient?Yes, no living will currently  , full code   List the Names of Other Physician/Practitioners you currently use:    Indicate any recent Medical Services you may have received from other than [facility] providers in the past year (date may be approximate).   Assessment:    Annual Wellness Exam , initial  Plan:     Medicare Attestation  I have personally reviewed:  The patient's medical and social history  Their use of alcohol, tobacco or illicit drugs  Their current medications and supplements  The patient's functional ability including ADLs,fall risks, home safety risks, cognitive, and hearing and visual impairment  Diet and physical activities  Evidence for depression or mood disorders  The patient's weight, height, BMI, and visual acuity have been recorded in the chart. I have made referrals, counseling, and provided education to the patient based on review of the above and I have provided the patient with a written personalized care plan for preventive services.     Review of Systems     Objective:   Physical Exam  BP 142/84 mmHg  Pulse 85  Resp 16  Ht 5' 0.5" (1.537 m)  Wt 161 lb (73.029 kg)  BMI 30.91 kg/m2  SpO2 97%  Assessment & Plan:  Initial Medicare annual wellness visit Annual exam as documented. Counseling done  re healthy lifestyle involving commitment to 150 minutes exercise per week, heart healthy diet, and attaining healthy weight.The importance of adequate sleep also discussed. Regular seat belt use and home safety, is also discussed. Changes in health habits are decided on by the patient with goals and time frames  set for achieving  them. Immunization and cancer screening needs are specifically addressed at this visit.     Need for vaccination with 13-polyvalent pneumococcal conjugate vaccine After obtaining informed consent, the vaccine is  administered by LPN.

## 2014-05-15 LAB — HEMOGLOBIN A1C
Hgb A1c MFr Bld: 6 % — ABNORMAL HIGH (ref <5.7)
Mean Plasma Glucose: 126 mg/dL — ABNORMAL HIGH (ref <117)

## 2014-05-15 LAB — VITAMIN D 25 HYDROXY (VIT D DEFICIENCY, FRACTURES): Vit D, 25-Hydroxy: 20 ng/mL — ABNORMAL LOW (ref 30–100)

## 2014-05-20 MED ORDER — VITAMIN D (ERGOCALCIFEROL) 1.25 MG (50000 UNIT) PO CAPS
50000.0000 [IU] | ORAL_CAPSULE | ORAL | Status: DC
Start: 2014-05-20 — End: 2015-04-08

## 2014-06-06 ENCOUNTER — Ambulatory Visit (HOSPITAL_COMMUNITY)
Admission: RE | Admit: 2014-06-06 | Discharge: 2014-06-06 | Disposition: A | Discharge status: Home / Self Care | Payer: BLUE CROSS/BLUE SHIELD | Source: Ambulatory Visit | Attending: Family Medicine | Admitting: Family Medicine

## 2014-06-06 DIAGNOSIS — Z1231 Encounter for screening mammogram for malignant neoplasm of breast: Secondary | ICD-10-CM | POA: Insufficient documentation

## 2014-09-02 ENCOUNTER — Encounter: Payer: Medicare Other | Admitting: Family Medicine

## 2015-03-02 DIAGNOSIS — M1711 Unilateral primary osteoarthritis, right knee: Secondary | ICD-10-CM | POA: Diagnosis not present

## 2015-03-02 DIAGNOSIS — M25461 Effusion, right knee: Secondary | ICD-10-CM | POA: Diagnosis not present

## 2015-03-02 DIAGNOSIS — W228XXA Striking against or struck by other objects, initial encounter: Secondary | ICD-10-CM | POA: Diagnosis not present

## 2015-03-02 DIAGNOSIS — S8391XA Sprain of unspecified site of right knee, initial encounter: Secondary | ICD-10-CM | POA: Diagnosis not present

## 2015-03-02 DIAGNOSIS — Z7982 Long term (current) use of aspirin: Secondary | ICD-10-CM | POA: Diagnosis not present

## 2015-03-05 ENCOUNTER — Telehealth: Payer: Self-pay | Admitting: Family Medicine

## 2015-03-05 DIAGNOSIS — R7303 Prediabetes: Secondary | ICD-10-CM

## 2015-03-05 DIAGNOSIS — Z1159 Encounter for screening for other viral diseases: Secondary | ICD-10-CM

## 2015-03-05 DIAGNOSIS — E8881 Metabolic syndrome: Secondary | ICD-10-CM

## 2015-03-05 DIAGNOSIS — Z13220 Encounter for screening for lipoid disorders: Secondary | ICD-10-CM

## 2015-03-05 DIAGNOSIS — E559 Vitamin D deficiency, unspecified: Secondary | ICD-10-CM

## 2015-03-05 NOTE — Telephone Encounter (Signed)
Amber Fernandez is asking for a letter from Amber Fernandez stating that she is scheduled for a physical on 06/11/15 at 8:30 for her job [employer]) so she does not have to pay the [employer] for Insurance, please advise?

## 2015-03-05 NOTE — Telephone Encounter (Addendum)
Noted , if she can have physical mooved sooner OK to do so, to sometime in March Let her know when she is in for physical I willl assess need for sticker, current info does not justify/ demonstrate need.

## 2015-03-05 NOTE — Telephone Encounter (Signed)
Called and left voicemail for patient notifying of advice.  

## 2015-03-11 NOTE — Addendum Note (Signed)
Addended by: Kandis Fantasia B on: 03/11/2015 02:16 PM   Modules accepted: Orders

## 2015-03-11 NOTE — Telephone Encounter (Signed)
Patient aware of advice.  Will change appointment to followup and she will have annual labs done.  Patient scheduled and lab order mailed to home address

## 2015-04-02 ENCOUNTER — Other Ambulatory Visit: Payer: Self-pay | Admitting: Family Medicine

## 2015-04-03 LAB — CBC WITH DIFFERENTIAL/PLATELET
Basophils Absolute: 0 10*3/uL (ref 0.0–0.1)
Basophils Relative: 1 % (ref 0–1)
EOS PCT: 4 % (ref 0–5)
Eosinophils Absolute: 0.1 10*3/uL (ref 0.0–0.7)
HCT: 36.4 % (ref 36.0–46.0)
Hemoglobin: 11.7 g/dL — ABNORMAL LOW (ref 12.0–15.0)
LYMPHS ABS: 1.8 10*3/uL (ref 0.7–4.0)
Lymphocytes Relative: 54 % — ABNORMAL HIGH (ref 12–46)
MCH: 25.7 pg — ABNORMAL LOW (ref 26.0–34.0)
MCHC: 32.1 g/dL (ref 30.0–36.0)
MCV: 79.8 fL (ref 78.0–100.0)
MONO ABS: 0.2 10*3/uL (ref 0.1–1.0)
MPV: 9.4 fL (ref 8.6–12.4)
Monocytes Relative: 5 % (ref 3–12)
Neutro Abs: 1.2 10*3/uL — ABNORMAL LOW (ref 1.7–7.7)
Neutrophils Relative %: 36 % — ABNORMAL LOW (ref 43–77)
PLATELETS: 222 10*3/uL (ref 150–400)
RBC: 4.56 MIL/uL (ref 3.87–5.11)
RDW: 16.9 % — AB (ref 11.5–15.5)
WBC: 3.4 10*3/uL — ABNORMAL LOW (ref 4.0–10.5)

## 2015-04-03 LAB — TSH: TSH: 2.2 m[IU]/L

## 2015-04-03 LAB — LIPID PANEL
CHOL/HDL RATIO: 2.4 ratio (ref <=5.0)
CHOLESTEROL: 190 mg/dL (ref 125–200)
HDL: 79 mg/dL (ref >=46)
LDL Cholesterol: 96 mg/dL (ref <130)
TRIGLYCERIDES: 73 mg/dL (ref <150)
VLDL: 15 mg/dL (ref <30)

## 2015-04-03 LAB — COMPREHENSIVE METABOLIC PANEL
ALT: 16 U/L (ref 6–29)
AST: 22 U/L (ref 10–35)
Albumin: 4.1 g/dL (ref 3.6–5.1)
Alkaline Phosphatase: 65 U/L (ref 33–130)
BILIRUBIN TOTAL: 0.4 mg/dL (ref 0.2–1.2)
BUN: 16 mg/dL (ref 7–25)
CO2: 28 mmol/L (ref 20–31)
CREATININE: 0.89 mg/dL (ref 0.50–0.99)
Calcium: 9.3 mg/dL (ref 8.6–10.4)
Chloride: 105 mmol/L (ref 98–110)
Glucose, Bld: 92 mg/dL (ref 65–99)
Potassium: 4.4 mmol/L (ref 3.5–5.3)
SODIUM: 140 mmol/L (ref 135–146)
TOTAL PROTEIN: 7.2 g/dL (ref 6.1–8.1)

## 2015-04-03 LAB — FERRITIN: Ferritin: 20 ng/mL (ref 20–288)

## 2015-04-03 LAB — HEMOGLOBIN A1C
Hgb A1c MFr Bld: 5.9 % — ABNORMAL HIGH (ref <5.7)
Mean Plasma Glucose: 123 mg/dL — ABNORMAL HIGH (ref <117)

## 2015-04-03 LAB — HEPATITIS C ANTIBODY: HCV Ab: NEGATIVE

## 2015-04-03 LAB — IRON: Iron: 34 ug/dL — ABNORMAL LOW (ref 45–160)

## 2015-04-03 LAB — VITAMIN D 25 HYDROXY (VIT D DEFICIENCY, FRACTURES): Vit D, 25-Hydroxy: 24 ng/mL — ABNORMAL LOW (ref 30–100)

## 2015-04-08 ENCOUNTER — Ambulatory Visit (INDEPENDENT_AMBULATORY_CARE_PROVIDER_SITE_OTHER): Payer: BLUE CROSS/BLUE SHIELD | Admitting: Family Medicine

## 2015-04-08 ENCOUNTER — Encounter: Payer: Self-pay | Admitting: Family Medicine

## 2015-04-08 VITALS — BP 124/82 | HR 78 | Resp 16 | Ht 61.0 in | Wt 154.0 lb

## 2015-04-08 DIAGNOSIS — M1712 Unilateral primary osteoarthritis, left knee: Secondary | ICD-10-CM | POA: Diagnosis not present

## 2015-04-08 DIAGNOSIS — E559 Vitamin D deficiency, unspecified: Secondary | ICD-10-CM

## 2015-04-08 DIAGNOSIS — D509 Iron deficiency anemia, unspecified: Secondary | ICD-10-CM

## 2015-04-08 DIAGNOSIS — Z1211 Encounter for screening for malignant neoplasm of colon: Secondary | ICD-10-CM | POA: Diagnosis not present

## 2015-04-08 DIAGNOSIS — D649 Anemia, unspecified: Secondary | ICD-10-CM | POA: Diagnosis not present

## 2015-04-08 DIAGNOSIS — E669 Obesity, unspecified: Secondary | ICD-10-CM

## 2015-04-08 DIAGNOSIS — M7989 Other specified soft tissue disorders: Secondary | ICD-10-CM

## 2015-04-08 DIAGNOSIS — R7303 Prediabetes: Secondary | ICD-10-CM

## 2015-04-08 LAB — POC HEMOCCULT BLD/STL (OFFICE/1-CARD/DIAGNOSTIC): Fecal Occult Blood, POC: NEGATIVE

## 2015-04-08 MED ORDER — VITAMIN D (ERGOCALCIFEROL) 1.25 MG (50000 UNIT) PO CAPS: 50000.0000 [IU] | ORAL_CAPSULE | ORAL | Status: DC

## 2015-04-08 NOTE — Progress Notes (Signed)
Subjective:    Patient ID: Amber Fernandez, female    DOB: 05/26/1945, 70 y.o.   MRN: [id number]  HPI   Amber EvesJanice M Fernandez     MRN: [id number]      DOB: 12/04/1945   HPI Ms. Amber BusmanDalton is here for follow up and re-evaluation of chronic medical conditions, medication management and review of any available recent lab and radiology data.  Preventive health is updated, specifically  Cancer screening and Immunization.   Questions or concerns regarding consultations or procedures which the PT has had in the interim are  Addressed.was recently in Ed for uncontrolled knee pain and swelling of left knee , no trauma inciting the symptom, requests handicap sticker which she has had in the past. Increased stress  and anxiety due to Mother's failing health , and she reports that sh is sole caregiver. The PT denies any adverse reactions to current medications since the last visit.  Recently donated blood per her custom, which is the most likely reason for low Hb will need follow up  ROS Denies recent fever or chills. Denies sinus pressure, nasal congestion, ear pain or sore throat. Denies chest congestion, productive cough or wheezing. Denies chest pains, palpitations and leg swelling Denies abdominal pain, nausea, vomiting,diarrhea or constipation.   Denies dysuria, frequency, hesitancy or incontinence.  Denies headaches, seizures, numbness, or tingling.  Denies skin break down or rash.   PE  BP 124/82 mmHg  Pulse 78  Resp 16  Ht 5\' 1"  (1.549 m)  Wt 154 lb (69.854 kg)  BMI 29.11 kg/m2  SpO2 98%  Patient alert and oriented and in no cardiopulmonary distress.  HEENT: No facial asymmetry, EOMI,   oropharynx pink and moist.  Neck supple no JVD, no mass.  Chest: Clear to auscultation bilaterally.  CVS: S1, S2 no murmurs, no S3.Regular rate.  ABD: Soft non tender. No organomegaly or mass, normal BS, heme negative stool , no mass  Ext: No edema  MS: Adequate ROM spine, shoulders, hips an  reduced in  Knees left greater than right, which also has swelling and crepitus  Skin: Intact, no ulcerations or rash noted.  Psych: Good eye contact, normal affect. Memory intact not anxious or depressed appearing.  CNS: CN 2-12 intact, power,  normal throughout.no focal deficits noted.   Assessment & Plan   Osteoarthritis of left knee Chronic , reports worsening to the extent that she a in the Ed within the past month due to pain. Reports being told htat she had severe arthritis. Record from Ed reviewed. Handicap sticker completed   Prediabetes Patient educated about the importance of limiting  Carbohydrate intake , the need to commit to daily physical activity for a minimum of 30 minutes , and to commit weight loss. The fact that changes in all these areas will reduce or eliminate all together the development of diabetes is stressed.  improved  Diabetic Labs Latest Ref Rng 04/02/2015 05/14/2014 08/29/2013 03/05/2013 10/17/2012  HbA1c <5.7 % 5.9(H) 6.0(H) 6.0(H) 6.3(H) 5.9(H)  Chol 125 - 200 mg/dL 272190 536185 - 644177 -  HDL >=03>=46 mg/dL 79 75 - 74 -  Calc LDL <130 mg/dL 96 95 - 90 -  Triglycerides <150 mg/dL 73 75 - 63 -  Creatinine 0.50 - 0.99 mg/dL 4.740.89 2.590.80 5.630.88 - -   BP/Weight 04/08/2015 05/14/2014 09/26/2013 07/05/2013 05/27/2013 02/20/2013 10/17/2012  Systolic BP 124 142 130 124 140 140 122  Diastolic BP 82 84 80 74 84 80 80  Wt. (Lbs)  154 161 157.4 - 161.08 163.8 160.4  BMI 29.11 30.91 31.77 - 32.52 33.07 32.38   No flowsheet data found.     OBESITY Improved. Patient re-educated about  the importance of commitment to a  minimum of 150 minutes of exercise per week.  The importance of healthy food choices with portion control discussed. Encouraged to start a food diary, count calories and to consider  joining a support group. Sample diet sheets offered. Goals set by the patient for the next several months.   Weight /BMI 04/08/2015 05/14/2014 09/26/2013  WEIGHT 154 lb 161 lb 157 lb 6.4  oz  HEIGHT  5' .5"   BMI 29.11 kg/m2 30.91 kg/m2 31.77 kg/m2    Current exercise per week 90 minutes.   Leg swelling Relies on use of compression hose for relief  IDA (iron deficiency anemia) New dx, rectal exam has no hidden blood in stool Pt reports donating blood within the past month , which is the likely cause of her symptoms Hold on donations and twice daily iron till rchecked  Vitamin D deficiency Needs to commit to weekly replacement      Review of Systems     Objective:   Physical Exam        Assessment & Plan:

## 2015-04-08 NOTE — Patient Instructions (Signed)
Annual physical exam in 4.5 month, call if you need me sooner  No more blood donations before next visit  Labs non fast for next visit  [facility]  Sorry about your Mom  Commit to weekly vit D, daily iron and healthy lifestyle, sugar has improved  Please call and schedule and get your mammogram in May it is due then,  Rectal exam today shows no hidden blood

## 2015-04-13 DIAGNOSIS — D509 Iron deficiency anemia, unspecified: Secondary | ICD-10-CM | POA: Insufficient documentation

## 2015-04-13 DIAGNOSIS — E559 Vitamin D deficiency, unspecified: Secondary | ICD-10-CM | POA: Insufficient documentation

## 2015-04-13 NOTE — Assessment & Plan Note (Signed)
Relies on use of compression hose for relief

## 2015-04-13 NOTE — Assessment & Plan Note (Signed)
Chronic , reports worsening to the extent that she a in the Ed within the past month due to pain. Reports being told htat she had severe arthritis. Record from Ed reviewed. Handicap sticker completed

## 2015-04-13 NOTE — Assessment & Plan Note (Signed)
Patient educated about the importance of limiting  Carbohydrate intake , the need to commit to daily physical activity for a minimum of 30 minutes , and to commit weight loss. The fact that changes in all these areas will reduce or eliminate all together the development of diabetes is stressed.  improved  Diabetic Labs Latest Ref Rng 04/02/2015 05/14/2014 08/29/2013 03/05/2013 10/17/2012  HbA1c <5.7 % 5.9(H) 6.0(H) 6.0(H) 6.3(H) 5.9(H)  Chol 125 - 200 mg/dL 244190 010185 - 272177 -  HDL >=53>=46 mg/dL 79 75 - 74 -  Calc LDL <130 mg/dL 96 95 - 90 -  Triglycerides <150 mg/dL 73 75 - 63 -  Creatinine 0.50 - 0.99 mg/dL 6.640.89 4.030.80 4.740.88 - -   BP/Weight 04/08/2015 05/14/2014 09/26/2013 07/05/2013 05/27/2013 02/20/2013 10/17/2012  Systolic BP 124 142 130 124 140 140 122  Diastolic BP 82 84 80 74 84 80 80  Wt. (Lbs) 154 161 157.4 - 161.08 163.8 160.4  BMI 29.11 30.91 31.77 - 32.52 33.07 32.38   No flowsheet data found.

## 2015-04-13 NOTE — Assessment & Plan Note (Signed)
Needs to commit to weekly replacement

## 2015-04-13 NOTE — Assessment & Plan Note (Signed)
Improved. Patient re-educated about  the importance of commitment to a  minimum of 150 minutes of exercise per week.  The importance of healthy food choices with portion control discussed. Encouraged to start a food diary, count calories and to consider  joining a support group. Sample diet sheets offered. Goals set by the patient for the next several months.   Weight /BMI 04/08/2015 05/14/2014 09/26/2013  WEIGHT 154 lb 161 lb 157 lb 6.4 oz  HEIGHT 5\' 1"  5' .5" 4\' 11"   BMI 29.11 kg/m2 30.91 kg/m2 31.77 kg/m2    Current exercise per week 90 minutes.

## 2015-04-13 NOTE — Assessment & Plan Note (Signed)
New dx, rectal exam has no hidden blood in stool Pt reports donating blood within the past month , which is the likely cause of her symptoms Hold on donations and twice daily iron till rchecked

## 2015-05-08 ENCOUNTER — Other Ambulatory Visit: Payer: Self-pay | Admitting: Family Medicine

## 2015-05-08 DIAGNOSIS — Z1231 Encounter for screening mammogram for malignant neoplasm of breast: Secondary | ICD-10-CM

## 2015-05-18 ENCOUNTER — Telehealth: Payer: Self-pay | Admitting: Family Medicine

## 2015-05-18 NOTE — Telephone Encounter (Signed)
Spoke with pt about $30 bill she received. She knows she has to pay the copay and that we didn't bill her insurance for a physical. However, he next visit will be her true physical

## 2015-06-10 ENCOUNTER — Ambulatory Visit (HOSPITAL_COMMUNITY)
Admission: RE | Admit: 2015-06-10 | Discharge: 2015-06-10 | Disposition: A | Discharge status: Home / Self Care | Payer: BLUE CROSS/BLUE SHIELD | Source: Ambulatory Visit | Attending: Family Medicine | Admitting: Family Medicine

## 2015-06-10 DIAGNOSIS — Z1231 Encounter for screening mammogram for malignant neoplasm of breast: Secondary | ICD-10-CM | POA: Diagnosis not present

## 2015-06-11 ENCOUNTER — Encounter: Payer: BLUE CROSS/BLUE SHIELD | Admitting: Family Medicine

## 2015-08-05 ENCOUNTER — Ambulatory Visit: Payer: BLUE CROSS/BLUE SHIELD | Admitting: Family Medicine

## 2015-09-03 LAB — CBC
HCT: 38.6 % (ref 35.0–45.0)
Hemoglobin: 12.6 g/dL (ref 11.7–15.5)
MCH: 25.5 pg — ABNORMAL LOW (ref 27.0–33.0)
MCHC: 32.6 g/dL (ref 32.0–36.0)
MCV: 78 fL — ABNORMAL LOW (ref 80.0–100.0)
MPV: 9.6 fL (ref 7.5–12.5)
Platelets: 221 10*3/uL (ref 140–400)
RBC: 4.95 MIL/uL (ref 3.80–5.10)
RDW: 16.2 % — AB (ref 11.0–15.0)
WBC: 3.8 10*3/uL (ref 3.8–10.8)

## 2015-09-03 LAB — IRON: IRON: 49 ug/dL (ref 45–160)

## 2015-09-03 LAB — FERRITIN: FERRITIN: 22 ng/mL (ref 20–288)

## 2015-09-09 ENCOUNTER — Encounter: Payer: Self-pay | Admitting: Family Medicine

## 2015-09-09 ENCOUNTER — Ambulatory Visit (INDEPENDENT_AMBULATORY_CARE_PROVIDER_SITE_OTHER): Payer: BLUE CROSS/BLUE SHIELD | Admitting: Family Medicine

## 2015-09-09 DIAGNOSIS — Z0000 Encounter for general adult medical examination without abnormal findings: Secondary | ICD-10-CM

## 2015-09-09 NOTE — Progress Notes (Signed)
Preventive Screening-Counseling & Management   Patient present here today for a Medicare annual wellness visit.   Current Problems (verified)   Medications Prior to Visit Allergies (verified)   PAST HISTORY  Family History (updated)  Social History Widow since 2003, 4 children living,ages 46 to to 89, works at [employer], never smoker   Risk Factors  Current exercise habits:  Currently limited due to caring for sick mother  Dietary issues discussed: eats variety of fruits and vegetables and limit fried fatty foods    Cardiac risk factors: Brother had MI in his 4850's   Depression Screen  (Note: if answer to either of the following is "Yes", a more complete depression screening is indicated)   Over the past two weeks, have you felt down, depressed or hopeless? No  Over the past two weeks, have you felt little interest or pleasure in doing things? No  Have you lost interest or pleasure in daily life? No  Do you often feel hopeless? No  Do you cry easily over simple problems? No   Activities of Daily Living  In your present state of health, do you have any difficulty performing the following activities?  Driving?: No Managing money?: No Feeding yourself?:No Getting from bed to chair?:No Climbing a flight of stairs?: yes due to arthritis  Preparing food and eating?:No Bathing or showering?:No Getting dressed?:No Getting to the toilet?:No Using the toilet?:No Moving around from place to place?: occasionally due to right knee pain  Fall Risk Assessment In the past year have you fallen or had a near fall?:No Are you currently taking any medications that make you dizzy?:No   Hearing Difficulties: No Do you often ask people to speak up or repeat themselves?:No Do you experience ringing or noises in your ears?:No Do you have difficulty understanding soft or whispered voices?:No  Cognitive Testing  Alert? Yes Normal Appearance?Yes  Oriented to person? Yes Place? Yes  Time?  Yes  Displays appropriate judgment?Yes  Can read the correct time from a watch face? yes Are you having problems remembering things?No  Advanced Directives have been discussed with the patient?Yes, will continue to work on it , full code,    List the Names of Other Physician/Practitioners you currently use: (updated)   Indicate any recent Medical Services you may have received from other than [facility] providers in the past year (date may be approximate).     Medicare Attestation  I have personally reviewed:  The patient's medical and social history  Their use of alcohol, tobacco or illicit drugs  Their current medications and supplements  The patient's functional ability including ADLs,fall risks, home safety risks, cognitive, and hearing and visual impairment  Diet and physical activities  Evidence for depression or mood disorders  The patient's weight, height, BMI, and visual acuity have been recorded in the chart. I have made referrals, counseling, and provided education to the patient based on review of the above and I have provided the patient with a written personalized care plan for preventive services.    Physical Exam BP 138/80   Pulse 66   Resp 16   Ht 5\' 1"  (1.549 m)   Wt 159 lb 6.4 oz (72.3 kg)   SpO2 99%   BMI 30.12 kg/m    Assessment & Plan:  Medicare annual wellness visit, subsequent Annual exam as documented. Counseling done  re healthy lifestyle involving commitment to 150 minutes exercise per week, heart healthy diet, and attaining healthy weight.The importance of adequate sleep also discussed.  Regular seat belt use and home safety, is also discussed. Changes in health habits are decided on by the patient with goals and time frames  set for achieving them. Immunization and cancer screening needs are specifically addressed at this visit.

## 2015-09-09 NOTE — Assessment & Plan Note (Signed)

## 2015-09-09 NOTE — Patient Instructions (Addendum)
Annual physical exam March 9 or after, call if you need me sooner  It is important that you exercise regularly at least 30 minutes 7 times a week. If you develop chest pain, have severe difficulty breathing, or feel very tired, stop exercising immediately and seek medical attention   Come in mid October for flu vaccine and TdAP if covered  Fasting CBC, iron and ferritin, lipid, cmp, TSH, hBA1C and vit D to be drawn March 3 or after, collect the order in October please   Advance Directive Advance directives are the legal documents that allow you to make choices about your health care and medical treatment if you cannot speak for yourself. Advance directives are a way for you to communicate your wishes to family, friends, and health care providers. The specified people can then convey your decisions about end-of-life care to avoid confusion if you should become unable to communicate. Ideally, the process of discussing and writing advance directives should happen over time rather than making decisions all at once. Advance directives can be modified as your situation changes, and you can change your mind at any time, even after you have signed the advance directives. Each state has its own laws regarding advance directives. You may want to check with your health care provider, attorney, or state representative about the law in your state. Below are some examples of advance directives. LIVING WILL A living will is a set of instructions documenting your wishes about medical care when you cannot care for yourself. It is used if you become:  Terminally ill.  Incapacitated.  Unable to communicate.  Unable to make decisions. Items to consider in your living will include:  The use or non-use of life-sustaining equipment, such as dialysis machines and breathing machines (ventilators).  A do not resuscitate (DNR) order, which is the instruction not to use cardiopulmonary resuscitation (CPR) if  breathing or heartbeat stops.  Tube feeding.  Withholding of food and fluids.  Comfort (palliative) care when the goal becomes comfort rather than a cure.  Organ and tissue donation. A living will does not give instructions about distribution of your money and property if you should pass away. It is advisable to seek the expert advice of a lawyer in drawing up a will regarding your possessions. Decisions about taxes, beneficiaries, and asset distribution will be legally binding. This process can relieve your family and friends of any burdens surrounding disputes or questions that may come up about the allocation of your assets. DO NOT RESUSCITATE (DNR) A do not resuscitate (DNR) order is a request to not have CPR in the event that your heart stops beating or you stop breathing. Unless given other instructions, a health care provider will try to help any patient whose heart has stopped or who has stopped breathing.  HEALTH CARE PROXY AND DURABLE POWER OF ATTORNEY FOR HEALTH CARE A health care proxy is a person (agent) appointed to make medical decisions for you if you cannot. Generally, people choose someone they know well and trust to represent their preferences when they can no longer do so. You should be sure to ask this person for agreement to act as your agent. An agent may have to exercise judgment in the event of a medical decision for which your wishes are not known. The durable power of attorney for health care is the legal document that names your health care proxy. Once written, it should be:  Signed.  Notarized.  Dated.  Copied.  Witnessed.  Incorporated into your medical record. You may also want to appoint someone to manage your financial affairs if you cannot. This is called a durable power of attorney for finances. It is a separate legal document from the durable power of attorney for health care. You may choose the same person or someone different from your health care  proxy to act as your agent in financial matters.   This information is not intended to replace advice given to you by your health care provider. Make sure you discuss any questions you have with your health care provider.   Document Released: 04/26/2007 Document Revised: 01/22/2013 Document Reviewed: 06/06/2012 Elsevier Interactive Patient Education [employer]

## 2015-09-12 ENCOUNTER — Encounter: Payer: Self-pay | Admitting: Family Medicine

## 2015-10-06 ENCOUNTER — Other Ambulatory Visit: Payer: Self-pay

## 2015-10-06 MED ORDER — VITAMIN D (ERGOCALCIFEROL) 1.25 MG (50000 UNIT) PO CAPS: 50000.0000 [IU] | ORAL_CAPSULE | ORAL | 5 refills | Status: DC

## 2015-11-13 ENCOUNTER — Telehealth: Payer: Self-pay

## 2015-11-13 DIAGNOSIS — Z13220 Encounter for screening for lipoid disorders: Secondary | ICD-10-CM

## 2015-11-13 DIAGNOSIS — E559 Vitamin D deficiency, unspecified: Secondary | ICD-10-CM

## 2015-11-13 DIAGNOSIS — R7303 Prediabetes: Secondary | ICD-10-CM

## 2015-11-13 DIAGNOSIS — D508 Other iron deficiency anemias: Secondary | ICD-10-CM

## 2015-11-13 MED ORDER — VITAMIN D (ERGOCALCIFEROL) 1.25 MG (50000 UNIT) PO CAPS: 50000.0000 [IU] | ORAL_CAPSULE | ORAL | 5 refills | Status: DC

## 2015-11-13 NOTE — Telephone Encounter (Signed)
Orders for labs place and Vitamin D refilled

## 2016-03-31 LAB — COMPREHENSIVE METABOLIC PANEL
ALT: 15 U/L (ref 6–29)
AST: 20 U/L (ref 10–35)
Albumin: 3.8 g/dL (ref 3.6–5.1)
Alkaline Phosphatase: 60 U/L (ref 33–130)
BUN: 10 mg/dL (ref 7–25)
CHLORIDE: 107 mmol/L (ref 98–110)
CO2: 28 mmol/L (ref 20–31)
CREATININE: 0.88 mg/dL (ref 0.50–0.99)
Calcium: 9.1 mg/dL (ref 8.6–10.4)
Glucose, Bld: 95 mg/dL (ref 65–99)
POTASSIUM: 4.7 mmol/L (ref 3.5–5.3)
SODIUM: 141 mmol/L (ref 135–146)
Total Bilirubin: 0.4 mg/dL (ref 0.2–1.2)
Total Protein: 6.9 g/dL (ref 6.1–8.1)

## 2016-03-31 LAB — LIPID PANEL
CHOL/HDL RATIO: 2.6 ratio (ref <5.0)
Cholesterol: 171 mg/dL (ref <200)
HDL: 66 mg/dL (ref >50)
LDL CALC: 90 mg/dL (ref <100)
TRIGLYCERIDES: 77 mg/dL (ref <150)
VLDL: 15 mg/dL (ref <30)

## 2016-03-31 LAB — IRON: Iron: 44 ug/dL — ABNORMAL LOW (ref 45–160)

## 2016-03-31 LAB — HEMOGLOBIN A1C
Hgb A1c MFr Bld: 5.6 % (ref <5.7)
Mean Plasma Glucose: 114 mg/dL

## 2016-03-31 LAB — CBC
HEMATOCRIT: 35.1 % (ref 35.0–45.0)
HEMOGLOBIN: 11.4 g/dL — AB (ref 11.7–15.5)
MCH: 26.3 pg — ABNORMAL LOW (ref 27.0–33.0)
MCHC: 32.5 g/dL (ref 32.0–36.0)
MCV: 81.1 fL (ref 80.0–100.0)
MPV: 9.3 fL (ref 7.5–12.5)
Platelets: 238 10*3/uL (ref 140–400)
RBC: 4.33 MIL/uL (ref 3.80–5.10)
RDW: 15.3 % — ABNORMAL HIGH (ref 11.0–15.0)
WBC: 3.4 10*3/uL — ABNORMAL LOW (ref 3.8–10.8)

## 2016-03-31 LAB — VITAMIN D 25 HYDROXY (VIT D DEFICIENCY, FRACTURES): VIT D 25 HYDROXY: 49 ng/mL (ref 30–100)

## 2016-03-31 LAB — TSH: TSH: 3.76 m[IU]/L

## 2016-03-31 LAB — FERRITIN: Ferritin: 19 ng/mL — ABNORMAL LOW (ref 20–288)

## 2016-04-12 ENCOUNTER — Other Ambulatory Visit: Payer: Self-pay

## 2016-04-12 ENCOUNTER — Encounter: Payer: Self-pay | Admitting: Family Medicine

## 2016-04-12 ENCOUNTER — Telehealth: Payer: Self-pay | Admitting: Family Medicine

## 2016-04-12 ENCOUNTER — Other Ambulatory Visit (HOSPITAL_COMMUNITY)
Admission: RE | Admit: 2016-04-12 | Discharge: 2016-04-12 | Disposition: A | Discharge status: Home / Self Care | Payer: BLUE CROSS/BLUE SHIELD | Source: Ambulatory Visit | Attending: Family Medicine | Admitting: Family Medicine

## 2016-04-12 ENCOUNTER — Ambulatory Visit (INDEPENDENT_AMBULATORY_CARE_PROVIDER_SITE_OTHER): Payer: BLUE CROSS/BLUE SHIELD | Admitting: Family Medicine

## 2016-04-12 VITALS — BP 138/80 | HR 83 | Resp 15 | Ht 61.0 in | Wt 157.0 lb

## 2016-04-12 DIAGNOSIS — Z13820 Encounter for screening for osteoporosis: Secondary | ICD-10-CM

## 2016-04-12 DIAGNOSIS — M19012 Primary osteoarthritis, left shoulder: Secondary | ICD-10-CM | POA: Diagnosis not present

## 2016-04-12 DIAGNOSIS — Z780 Asymptomatic menopausal state: Secondary | ICD-10-CM | POA: Diagnosis not present

## 2016-04-12 DIAGNOSIS — Z1231 Encounter for screening mammogram for malignant neoplasm of breast: Secondary | ICD-10-CM

## 2016-04-12 DIAGNOSIS — Z1211 Encounter for screening for malignant neoplasm of colon: Secondary | ICD-10-CM | POA: Diagnosis not present

## 2016-04-12 DIAGNOSIS — Z124 Encounter for screening for malignant neoplasm of cervix: Secondary | ICD-10-CM

## 2016-04-12 DIAGNOSIS — Z1239 Encounter for other screening for malignant neoplasm of breast: Secondary | ICD-10-CM

## 2016-04-12 DIAGNOSIS — Z0000 Encounter for general adult medical examination without abnormal findings: Secondary | ICD-10-CM | POA: Diagnosis not present

## 2016-04-12 HISTORY — DX: Primary osteoarthritis, left shoulder: M19.012

## 2016-04-12 LAB — POC HEMOCCULT BLD/STL (OFFICE/1-CARD/DIAGNOSTIC): Fecal Occult Blood, POC: NEGATIVE

## 2016-04-12 MED ORDER — CLOTRIMAZOLE-BETAMETHASONE 1-0.05 % EX CREA: TOPICAL_CREAM | Freq: Two times a day (BID) | CUTANEOUS | 1 refills | Status: DC

## 2016-04-12 NOTE — Assessment & Plan Note (Signed)

## 2016-04-12 NOTE — Assessment & Plan Note (Signed)
incresed blkood pressure, pre hypertensive today DASH diet and commitment to daily physical activity for a minimum of 30 minutes discussed and encouraged, as a part of hypertension management. The importance of attaining a healthy weight is also discussed.  BP/Weight 04/12/2016 09/09/2015 04/08/2015 05/14/2014 09/26/2013 07/05/2013 05/27/2013  Systolic BP 138 138 124 142 130 124 140  Diastolic BP 80 80 82 84 80 74 84  Wt. (Lbs) 157 159.4 154 161 157.4 - 161.08  BMI 29.66 30.12 29.11 30.91 31.77 - 32.52

## 2016-04-12 NOTE — Progress Notes (Signed)
    Amber EvesJanice M Haughn     MRN: [id number]      DOB: 04/04/1945  HPI: Patient is in for annual physical exam. No other health concerns are expressed or addressed at the visit. Recent labs, are reviewed. Immunization is reviewed , and  updated if needed.   PE: Pleasant  female, alert and oriented x 3, in no cardio-pulmonary distress. Afebrile. HEENT No facial trauma or asymetry. Sinuses non tender.  Extra occullar muscles intact, pupils equally reactive to light. External ears normal, tympanic membranes clear. Oropharynx moist, no exudate. Neck: supple, no adenopathy,JVD or thyromegaly.No bruits.  Chest: Clear to ascultation bilaterally.No crackles or wheezes. Non tender to palpation  Breast: No asymetry,no masses or lumps. No tenderness. No nipple discharge or inversion. No axillary or supraclavicular adenopathy  Cardiovascular system; Heart sounds normal,  S1 and  S2 ,no S3.  No murmur, or thrill. Apical beat not displaced Peripheral pulses normal.  Abdomen: Soft, non tender, no organomegaly or masses. No bruits. Bowel sounds normal. No guarding, tenderness or rebound.  Rectal:  Normal sphincter tone. No rectal mass. Guaiac negative stool.  GU: External genitalia normal female genitalia , normal female distribution of hair. No lesions. Urethral meatus normal in size, bladder   Prolapse, no lesions visibly  Present. Bladder non tender. Vagina pink and moist , with no visible lesions , discharge present . InAdequate pelvic support no  cystocele or rectocele noted Cervix pink and appears healthy, no lesions or ulcerations noted, no discharge noted from os Uterus normal size, no adnexal masses, no cervical motion or adnexal tenderness.   Musculoskeletal exam: Full ROM of spine, hips , reduced ROM left  shoulder and right  knee. No deformity ,swelling or crepitus noted. No muscle wasting or atrophy.   Neurologic: Cranial nerves 2 to 12 intact. Power, tone  ,sensation and reflexes normal throughout. No disturbance in gait. No tremor.  Skin: Intact, no ulceration, erythema , scaling or rash noted. Pigmentation normal throughout  Psych; Normal mood and affect. Judgement and concentration normal   Assessment & Plan:  Annual physical exam Annual exam as documented. Counseling done  re healthy lifestyle involving commitment to 150 minutes exercise per week, heart healthy diet, and attaining healthy weight.The importance of adequate sleep also discussed. Regular seat belt use and home safety, is also discussed. Changes in health habits are decided on by the patient with goals and time frames  set for achieving them. Immunization and cancer screening needs are specifically addressed at this visit.

## 2016-04-12 NOTE — Telephone Encounter (Signed)
Cream refilled

## 2016-04-12 NOTE — Addendum Note (Signed)
Addended by: Abner GreenspanHUDY, BRANDI H on: 04/12/2016 12:49 PM   Modules accepted: Orders

## 2016-04-12 NOTE — Patient Instructions (Addendum)
Wellness visit with Nurse , has straight medicare reportedly end September, call if you need me sooner  PLEASE start twice daily calcium with vit D1200 mg/1000IU, when you finish weekly vit D prescrioption no more will be prescribed  Continue daily iron  You are referred for bone density test, we will call with appt  Please get mammogram when due May 11 or after , I have entered referral  Please change eating blood pressure slightly high and work on weight loss of 8 pound  Thank you  for choosing [facility]. We consider it a privelige to serve you.  Delivering excellent health care in a caring and  compassionate way is our goal.  Partnering with you,  so that together we can achieve this goal is our strategy.

## 2016-04-12 NOTE — Telephone Encounter (Signed)
[name] is stating when she was here this morning Dr. Lodema HongSimpson was going to refill her clotrimazole-betamethasone (LOTRISONE) cream and also to let her know what multivitiamin w/calicium does she recommend she take, please advise?

## 2016-04-13 LAB — CYTOLOGY - PAP: DIAGNOSIS: NEGATIVE

## 2016-05-31 ENCOUNTER — Other Ambulatory Visit: Payer: Self-pay | Admitting: Family Medicine

## 2016-05-31 ENCOUNTER — Telehealth: Payer: Self-pay | Admitting: Family Medicine

## 2016-05-31 DIAGNOSIS — M1711 Unilateral primary osteoarthritis, right knee: Secondary | ICD-10-CM

## 2016-05-31 NOTE — Telephone Encounter (Signed)
The form Is signed, should she call again since the dMV has called in also

## 2016-05-31 NOTE — Telephone Encounter (Signed)
Patient came in with application for handicapped registration plate.   She wanted to let you know that she has had these tags since 2003 and you signed for it last year. [place] told her that someone turned them in 2016.   She states she fell last Monday in the building. [employer] employee parking is far from the entrance.  She is questioning why you will not sign and request that you call her at  [phone].

## 2016-05-31 NOTE — Telephone Encounter (Signed)
Spoke with patient , reports recurrent buckling of the right knee esp on the 3 days that she works, fell twice in the past 1 week as a result of this States she has had a permanent handicap sticker due to knee problems since 2003. I advised ortho eval since she reports so many falls and increased instability, she is being referred to ortho in [place] for  eval, and she agrees to go

## 2016-06-16 ENCOUNTER — Ambulatory Visit (INDEPENDENT_AMBULATORY_CARE_PROVIDER_SITE_OTHER): Payer: BLUE CROSS/BLUE SHIELD | Admitting: Orthopaedic Surgery

## 2016-06-16 ENCOUNTER — Encounter (INDEPENDENT_AMBULATORY_CARE_PROVIDER_SITE_OTHER): Payer: Self-pay | Admitting: Orthopaedic Surgery

## 2016-06-16 ENCOUNTER — Ambulatory Visit (INDEPENDENT_AMBULATORY_CARE_PROVIDER_SITE_OTHER): Payer: Self-pay

## 2016-06-16 VITALS — BP 138/63 | HR 78 | Ht 60.0 in | Wt 155.0 lb

## 2016-06-16 DIAGNOSIS — G8929 Other chronic pain: Secondary | ICD-10-CM | POA: Diagnosis not present

## 2016-06-16 DIAGNOSIS — M1711 Unilateral primary osteoarthritis, right knee: Secondary | ICD-10-CM | POA: Diagnosis not present

## 2016-06-16 DIAGNOSIS — M25561 Pain in right knee: Secondary | ICD-10-CM | POA: Diagnosis not present
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
## 2016-06-28 ENCOUNTER — Other Ambulatory Visit: Payer: Self-pay | Admitting: Family Medicine

## 2016-06-28 DIAGNOSIS — Z1231 Encounter for screening mammogram for malignant neoplasm of breast: Secondary | ICD-10-CM

## 2016-07-06 ENCOUNTER — Ambulatory Visit (HOSPITAL_COMMUNITY)
Admission: RE | Admit: 2016-07-06 | Discharge: 2016-07-06 | Disposition: A | Discharge status: Home / Self Care | Payer: BLUE CROSS/BLUE SHIELD | Source: Ambulatory Visit | Attending: Family Medicine | Admitting: Family Medicine

## 2016-07-06 DIAGNOSIS — Z1231 Encounter for screening mammogram for malignant neoplasm of breast: Secondary | ICD-10-CM

## 2016-07-06 DIAGNOSIS — R928 Other abnormal and inconclusive findings on diagnostic imaging of breast: Secondary | ICD-10-CM | POA: Insufficient documentation

## 2016-07-11 ENCOUNTER — Other Ambulatory Visit: Payer: Self-pay | Admitting: Family Medicine

## 2016-07-11 DIAGNOSIS — N6489 Other specified disorders of breast: Secondary | ICD-10-CM

## 2016-07-19 ENCOUNTER — Ambulatory Visit (HOSPITAL_COMMUNITY)
Admission: RE | Admit: 2016-07-19 | Discharge: 2016-07-19 | Disposition: A | Discharge status: Home / Self Care | Payer: BLUE CROSS/BLUE SHIELD | Source: Ambulatory Visit | Attending: Family Medicine | Admitting: Family Medicine

## 2016-07-19 DIAGNOSIS — N6312 Unspecified lump in the right breast, upper inner quadrant: Secondary | ICD-10-CM | POA: Diagnosis not present

## 2016-07-19 DIAGNOSIS — N6489 Other specified disorders of breast: Secondary | ICD-10-CM

## 2016-07-19 DIAGNOSIS — R928 Other abnormal and inconclusive findings on diagnostic imaging of breast: Secondary | ICD-10-CM | POA: Diagnosis not present

## 2016-07-24 ENCOUNTER — Other Ambulatory Visit: Payer: Self-pay | Admitting: Family Medicine

## 2016-07-24 DIAGNOSIS — R928 Other abnormal and inconclusive findings on diagnostic imaging of breast: Secondary | ICD-10-CM

## 2016-08-02 NOTE — Progress Notes (Signed)
Left Amber Fernandez a message to call office

## 2016-09-28 ENCOUNTER — Telehealth: Payer: Self-pay

## 2016-09-28 NOTE — Telephone Encounter (Signed)
Called pt to reschedule Medicare Annual Wellness Visit. -nr  

## 2016-10-19 ENCOUNTER — Telehealth: Payer: Self-pay | Admitting: Family Medicine

## 2016-10-19 NOTE — Telephone Encounter (Signed)
Patient calling re the mammogram that is due in Dec. She went to [facility] today to schedule, but she was told the information (the order) that is in the computer is not accurate. Please let patient know when this is corrected and she may schedule.   cb  [phone]

## 2016-10-21 ENCOUNTER — Other Ambulatory Visit: Payer: Self-pay | Admitting: Family Medicine

## 2016-10-21 NOTE — Telephone Encounter (Signed)
Spoke with [facility] radiology, the orders were placed incorrectly. I have pended the correct orders for signature.   Please advise if you can sign, or wait for Dr. Lodema Hong

## 2016-10-21 NOTE — Telephone Encounter (Signed)
Its better if Dr Kathie Rhodes. signs them, then the result goes to her instead of me.

## 2016-11-07 ENCOUNTER — Other Ambulatory Visit: Payer: Self-pay | Admitting: Family Medicine

## 2016-11-07 DIAGNOSIS — R928 Other abnormal and inconclusive findings on diagnostic imaging of breast: Secondary | ICD-10-CM

## 2016-11-08 ENCOUNTER — Other Ambulatory Visit: Payer: Self-pay | Admitting: Family Medicine

## 2016-11-08 DIAGNOSIS — R928 Other abnormal and inconclusive findings on diagnostic imaging of breast: Secondary | ICD-10-CM

## 2016-11-08 NOTE — Telephone Encounter (Signed)
Pt notified that correct orders have been entered so she can schedule her mammogram

## 2016-11-16 ENCOUNTER — Ambulatory Visit: Payer: BLUE CROSS/BLUE SHIELD

## 2017-02-07 ENCOUNTER — Ambulatory Visit (HOSPITAL_COMMUNITY)
Admission: RE | Admit: 2017-02-07 | Discharge: 2017-02-07 | Disposition: A | Discharge status: Home / Self Care | Payer: BLUE CROSS/BLUE SHIELD | Source: Ambulatory Visit | Attending: Family Medicine | Admitting: Family Medicine

## 2017-02-07 ENCOUNTER — Encounter (HOSPITAL_COMMUNITY): Payer: BLUE CROSS/BLUE SHIELD

## 2017-02-07 DIAGNOSIS — R928 Other abnormal and inconclusive findings on diagnostic imaging of breast: Secondary | ICD-10-CM | POA: Insufficient documentation

## 2017-03-01 DIAGNOSIS — J069 Acute upper respiratory infection, unspecified: Secondary | ICD-10-CM | POA: Diagnosis not present

## 2017-04-05 ENCOUNTER — Ambulatory Visit: Payer: BLUE CROSS/BLUE SHIELD

## 2017-04-05 ENCOUNTER — Telehealth: Payer: Self-pay

## 2017-04-05 ENCOUNTER — Ambulatory Visit (INDEPENDENT_AMBULATORY_CARE_PROVIDER_SITE_OTHER): Payer: BLUE CROSS/BLUE SHIELD

## 2017-04-05 VITALS — BP 170/70 | HR 73 | Temp 97.4°F | Resp 16 | Ht 59.0 in | Wt 159.0 lb

## 2017-04-05 DIAGNOSIS — Z0000 Encounter for general adult medical examination without abnormal findings: Secondary | ICD-10-CM | POA: Diagnosis not present

## 2017-04-05 DIAGNOSIS — E8881 Metabolic syndrome: Secondary | ICD-10-CM

## 2017-04-05 DIAGNOSIS — R7303 Prediabetes: Secondary | ICD-10-CM

## 2017-04-05 DIAGNOSIS — E559 Vitamin D deficiency, unspecified: Secondary | ICD-10-CM

## 2017-04-05 DIAGNOSIS — D508 Other iron deficiency anemias: Secondary | ICD-10-CM

## 2017-04-05 MED ORDER — CLOTRIMAZOLE-BETAMETHASONE 1-0.05 % EX CREA: TOPICAL_CREAM | Freq: Two times a day (BID) | CUTANEOUS | 1 refills | Status: DC

## 2017-04-05 NOTE — Telephone Encounter (Signed)
Labs ordered.
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
## 2017-04-05 NOTE — Patient Instructions (Signed)
[name] , Thank you for taking time to come for your Medicare Wellness Visit. I appreciate your ongoing commitment to your health goals. Please review the following plan we discussed and let me know if I can assist you in the future.   Screening recommendations/referrals: Colonoscopy: Up to date Mammogram: Up to date Bone Density: ORdered Recommended yearly ophthalmology/optometry visit for glaucoma screening and checkup Recommended yearly dental visit for hygiene and checkup  Vaccinations: Influenza vaccine: Due- does not want Pneumococcal vaccine: Up to date Tdap vaccine: Up to date Shingles vaccine: Up to date    Advanced directives: Working on one  Conditions/risks identified: None  Next appointment: Schedule CPE   Preventive Care 70 Years and Older, Female Preventive care refers to lifestyle choices and visits with your health care provider that can promote health and wellness. What does preventive care include?  A yearly physical exam. This is also called an annual well check.  Dental exams once or twice a year.  Routine eye exams. Ask your health care provider how often you should have your eyes checked.  Personal lifestyle choices, including:  Daily care of your teeth and gums.  Regular physical activity.  Eating a healthy diet.  Avoiding tobacco and drug use.  Limiting alcohol use.  Practicing safe sex.  Taking low-dose aspirin every day.  Taking vitamin and mineral supplements as recommended by your health care provider. What happens during an annual well check? The services and screenings done by your health care provider during your annual well check will depend on your age, overall health, lifestyle risk factors, and family history of disease. Counseling  Your health care provider may ask you questions about your:  Alcohol use.  Tobacco use.  Drug use.  Emotional well-being.  Home and relationship well-being.  Sexual activity.  Eating  habits.  History of falls.  Memory and ability to understand (cognition).  Work and work Astronomerenvironment.  Reproductive health. Screening  You may have the following tests or measurements:  Height, weight, and BMI.  Blood pressure.  Lipid and cholesterol levels. These may be checked every 5 years, or more frequently if you are over 70 years old.  Skin check.  Lung cancer screening. You may have this screening every year starting at age 70 if you have a 30-pack-year history of smoking and currently smoke or have quit within the past 15 years.  Fecal occult blood test (FOBT) of the stool. You may have this test every year starting at age 70.  Flexible sigmoidoscopy or colonoscopy. You may have a sigmoidoscopy every 5 years or a colonoscopy every 10 years starting at age 70.  Hepatitis C blood test.  Hepatitis B blood test.  Sexually transmitted disease (STD) testing.  Diabetes screening. This is done by checking your blood sugar (glucose) after you have not eaten for a while (fasting). You may have this done every 1-3 years.  Bone density scan. This is done to screen for osteoporosis. You may have this done starting at age 70.  Mammogram. This may be done every 1-2 years. Talk to your health care provider about how often you should have regular mammograms. Talk with your health care provider about your test results, treatment options, and if necessary, the need for more tests. Vaccines  Your health care provider may recommend certain vaccines, such as:  Influenza vaccine. This is recommended every year.  Tetanus, diphtheria, and acellular pertussis (Tdap, Td) vaccine. You may need a Td booster every 10 years.  Zoster vaccine. You may need this after age 70.  Pneumococcal 13-valent conjugate (PCV13) vaccine. One dose is recommended after age 70.  Pneumococcal polysaccharide (PPSV23) vaccine. One dose is recommended after age 70. Talk to your health care provider about which  screenings and vaccines you need and how often you need them. This information is not intended to replace advice given to you by your health care provider. Make sure you discuss any questions you have with your health care provider. Document Released: 02/13/2015 Document Revised: 10/07/2015 Document Reviewed: 11/18/2014 Elsevier Interactive Patient Education  2017 [place] Prevention in the Home Falls can cause injuries. They can happen to people of all ages. There are many things you can do to make your home safe and to help prevent falls. What can I do on the outside of my home?  Regularly fix the edges of walkways and driveways and fix any cracks.  Remove anything that might make you trip as you walk through a door, such as a raised step or threshold.  Trim any bushes or trees on the path to your home.  Use bright outdoor lighting.  Clear any walking paths of anything that might make someone trip, such as rocks or tools.  Regularly check to see if handrails are loose or broken. Make sure that both sides of any steps have handrails.  Any raised decks and porches should have guardrails on the edges.  Have any leaves, snow, or ice cleared regularly.  Use sand or salt on walking paths during winter.  Clean up any spills in your garage right away. This includes oil or grease spills. What can I do in the bathroom?  Use night lights.  Install grab bars by the toilet and in the tub and shower. Do not use towel bars as grab bars.  Use non-skid mats or decals in the tub or shower.  If you need to sit down in the shower, use a plastic, non-slip stool.  Keep the floor dry. Clean up any water that spills on the floor as soon as it happens.  Remove soap buildup in the tub or shower regularly.  Attach bath mats securely with double-sided non-slip rug tape.  Do not have throw rugs and other things on the floor that can make you trip. What can I do in the bedroom?  Use  night lights.  Make sure that you have a light by your bed that is easy to reach.  Do not use any sheets or blankets that are too big for your bed. They should not hang down onto the floor.  Have a firm chair that has side arms. You can use this for support while you get dressed.  Do not have throw rugs and other things on the floor that can make you trip. What can I do in the kitchen?  Clean up any spills right away.  Avoid walking on wet floors.  Keep items that you use a lot in easy-to-reach places.  If you need to reach something above you, use a strong step stool that has a grab bar.  Keep electrical cords out of the way.  Do not use floor polish or wax that makes floors slippery. If you must use wax, use non-skid floor wax.  Do not have throw rugs and other things on the floor that can make you trip. What can I do with my stairs?  Do not leave any items on the stairs.  Make sure that there are  handrails on both sides of the stairs and use them. Fix handrails that are broken or loose. Make sure that handrails are as long as the stairways.  Check any carpeting to make sure that it is firmly attached to the stairs. Fix any carpet that is loose or worn.  Avoid having throw rugs at the top or bottom of the stairs. If you do have throw rugs, attach them to the floor with carpet tape.  Make sure that you have a light switch at the top of the stairs and the bottom of the stairs. If you do not have them, ask someone to add them for you. What else can I do to help prevent falls?  Wear shoes that:  Do not have high heels.  Have rubber bottoms.  Are comfortable and fit you well.  Are closed at the toe. Do not wear sandals.  If you use a stepladder:  Make sure that it is fully opened. Do not climb a closed stepladder.  Make sure that both sides of the stepladder are locked into place.  Ask someone to hold it for you, if possible.  Clearly mark and make sure that you  can see:  Any grab bars or handrails.  First and last steps.  Where the edge of each step is.  Use tools that help you move around (mobility aids) if they are needed. These include:  Canes.  Walkers.  Scooters.  Crutches.  Turn on the lights when you go into a dark area. Replace any light bulbs as soon as they burn out.  Set up your furniture so you have a clear path. Avoid moving your furniture around.  If any of your floors are uneven, fix them.  If there are any pets around you, be aware of where they are.  Review your medicines with your doctor. Some medicines can make you feel dizzy. This can increase your chance of falling. Ask your doctor what other things that you can do to help prevent falls. This information is not intended to replace advice given to you by your health care provider. Make sure you discuss any questions you have with your health care provider. Document Released: 11/13/2008 Document Revised: 06/25/2015 Document Reviewed: 02/21/2014 Elsevier Interactive Patient Education  2017 [employer].

## 2017-04-21 LAB — COMPLETE METABOLIC PANEL WITH GFR
AG Ratio: 1.4 (calc) (ref 1.0–2.5)
ALBUMIN MSPROF: 4.2 g/dL (ref 3.6–5.1)
ALKALINE PHOSPHATASE (APISO): 65 U/L (ref 33–130)
ALT: 18 U/L (ref 6–29)
AST: 22 U/L (ref 10–35)
BUN: 14 mg/dL (ref 7–25)
CALCIUM: 9.4 mg/dL (ref 8.6–10.4)
CO2: 30 mmol/L (ref 20–32)
CREATININE: 0.82 mg/dL (ref 0.50–0.99)
Chloride: 107 mmol/L (ref 98–110)
GFR, Est African American: 85 mL/min/{1.73_m2} (ref >=60)
GFR, Est Non African American: 73 mL/min/{1.73_m2} (ref >=60)
GLUCOSE: 116 mg/dL — AB (ref 65–99)
Globulin: 3.1 g/dL (calc) (ref 1.9–3.7)
Potassium: 4.1 mmol/L (ref 3.5–5.3)
Sodium: 142 mmol/L (ref 135–146)
Total Bilirubin: 0.6 mg/dL (ref 0.2–1.2)
Total Protein: 7.3 g/dL (ref 6.1–8.1)

## 2017-04-21 LAB — TSH: TSH: 1.7 m[IU]/L (ref 0.40–4.50)

## 2017-04-21 LAB — HEMOGLOBIN A1C
HEMOGLOBIN A1C: 5.9 %{Hb} — AB (ref <5.7)
MEAN PLASMA GLUCOSE: 123 (calc)
eAG (mmol/L): 6.8 (calc)

## 2017-04-21 LAB — CBC
HEMATOCRIT: 38.2 % (ref 35.0–45.0)
Hemoglobin: 13 g/dL (ref 11.7–15.5)
MCH: 26.9 pg — AB (ref 27.0–33.0)
MCHC: 34 g/dL (ref 32.0–36.0)
MCV: 79.1 fL — ABNORMAL LOW (ref 80.0–100.0)
MPV: 9.9 fL (ref 7.5–12.5)
PLATELETS: 223 10*3/uL (ref 140–400)
RBC: 4.83 10*6/uL (ref 3.80–5.10)
RDW: 13.5 % (ref 11.0–15.0)
WBC: 4 10*3/uL (ref 3.8–10.8)

## 2017-04-21 LAB — LIPID PANEL
Cholesterol: 176 mg/dL (ref <200)
HDL: 62 mg/dL (ref >50)
LDL Cholesterol (Calc): 98 mg/dL (calc)
Non-HDL Cholesterol (Calc): 114 mg/dL (calc) (ref <130)
Total CHOL/HDL Ratio: 2.8 (calc) (ref <5.0)
Triglycerides: 75 mg/dL (ref <150)

## 2017-04-21 LAB — VITAMIN D 25 HYDROXY (VIT D DEFICIENCY, FRACTURES): Vit D, 25-Hydroxy: 25 ng/mL — ABNORMAL LOW (ref 30–100)

## 2017-04-27 ENCOUNTER — Encounter: Payer: Self-pay | Admitting: Family Medicine

## 2017-04-27 ENCOUNTER — Telehealth (HOSPITAL_COMMUNITY): Payer: Self-pay

## 2017-04-27 ENCOUNTER — Ambulatory Visit (INDEPENDENT_AMBULATORY_CARE_PROVIDER_SITE_OTHER): Payer: BLUE CROSS/BLUE SHIELD | Admitting: Family Medicine

## 2017-04-27 VITALS — BP 138/80 | HR 89 | Resp 16 | Ht 59.0 in | Wt 157.0 lb

## 2017-04-27 DIAGNOSIS — Z0000 Encounter for general adult medical examination without abnormal findings: Secondary | ICD-10-CM

## 2017-04-27 DIAGNOSIS — F4321 Adjustment disorder with depressed mood: Secondary | ICD-10-CM

## 2017-04-27 DIAGNOSIS — Z23 Encounter for immunization: Secondary | ICD-10-CM

## 2017-04-27 DIAGNOSIS — F320 Major depressive disorder, single episode, mild: Secondary | ICD-10-CM | POA: Diagnosis not present

## 2017-04-27 MED ORDER — SERTRALINE HCL 25 MG PO TABS
25.0000 mg | ORAL_TABLET | Freq: Every day | ORAL | 5 refills | Status: DC
Start: 2017-04-27 — End: 2017-05-23

## 2017-04-27 NOTE — Patient Instructions (Addendum)
F/U in  2 months, call if yu need me sooner  TdAP today  Please return 3 stool cards  You are to start medication today for grief and depression and also Ava will call to help you through this with teletherapy  Please cut back on tea and sweetened drinks, blood sugar is slightly high  Start vitD3 1000 IU OTC daily  Thank you  for choosing [facility]. We consider it a privelige to serve you.  Delivering excellent health care in a caring and  compassionate way is our goal.  Partnering with you,  so that together we can achieve this goal is our strategy.

## 2017-04-27 NOTE — Telephone Encounter (Signed)
[facility] left a voice mail message   

## 2017-04-28 ENCOUNTER — Telehealth: Payer: Self-pay

## 2017-04-28 DIAGNOSIS — F331 Major depressive disorder, recurrent, moderate: Secondary | ICD-10-CM

## 2017-04-28 NOTE — BH Specialist Note (Signed)
Recently prescribed medication - She will pick up her prescription tomorrow.

## 2017-04-28 NOTE — BH Specialist Note (Signed)
[place] [facility] Initial Clinical Assessment  MRN: [id number] NAME: Amber Fernandez Date: 04/28/17  Start time: 9am - 9:30am  Total time: 30 minutes  Type of Contact: Type of Contact: Phone Call Initial Contact Patient consent obtained: Patient consent obtained for Virtual Visit: (NA) Reason for Visit today: Reason for Your Call/Visit Today: (Initial Clinical Assessment )  Treatment History Patient recently received Inpatient Treatment: Have You Recently Been in Any Inpatient Treatment (Hospital/Detox/Crisis Center/28-Day Program)?: No  Facility/Program:  NA  Date of discharge:  NA Patient currently being seen by therapist/psychiatrist: Do You Currently Have a Therapist/Psychiatrist?: No Patient currently receiving the following services: Patient Currently Receiving the Following Services:: (None Reported)  Past Psychiatric History/Hospitalization(s): No Anxiety: No Bipolar Disorder: No Depression: Yes Mania: NA Psychosis: No Schizophrenia: No Personality Disorder: No Hospitalization for psychiatric illness: NA History of Electroconvulsive Shock Therapy: NA Prior Suicide Attempts: NA  Decreased need for sleep: No Euphoria: No  Family history of mental illness: No  Family history of substance abuse: No  Substance Abuse: No Physical, sexual or emotional abuse: No  Prior outpatient therapy: Yes, grief counseling when her mother died.    Clinical Assessment:  PHQ-9 Assessments: Depression screen [facility] 2/9 04/28/2017 04/27/2017 04/05/2017  Decreased Interest 1 3 1   Down, Depressed, Hopeless 1 2 2   PHQ - 2 Score 2 5 3   Altered sleeping 2 2 1   Tired, decreased energy 1 1 1   Change in appetite 1 3 0  Feeling bad or failure about yourself  3 3 0  Trouble concentrating 1 0 0  Moving slowly or fidgety/restless 0 3 0  Suicidal thoughts 0 0 0  PHQ-9 Score 10 17 5   Difficult doing work/chores - - Somewhat difficult    GAD-7 Assessments: GAD 7 : Generalized Anxiety Score  04/28/2017  Nervous, Anxious, on Edge 0  Control/stop worrying 0  Worry too much - different things 0  Trouble relaxing 0  Restless 0  Easily annoyed or irritable 0  Afraid - awful might happen 0  Total GAD 7 Score 0     Social Functioning Social maturity: Social Maturity: Responsible Social judgement: Social Judgement: Normal  Stress Current stressors: Current Stressors: Grief/losses, Family death(Mother died in 2016/05/31) Familial stressors: Familial Stressors: None Sleep: Sleep: No problems Appetite: Appetite: Decreased, Loss of appetite Coping ability: Coping ability: Normal Patient taking medications as prescribed: Patient taking medications as prescribed: Yes(Recently prescribed medication - She will pick up her prescription tomorrow. )  Current medications:  Outpatient Encounter Medications as of 04/28/2017  Medication Sig  . aspirin 81 MG tablet Take 81 mg by mouth daily.    . Calcium Carbonate-Vitamin D (CALCIUM 500 + D) 500-125 MG-UNIT TABS Take by mouth. 1200mg   . clotrimazole-betamethasone (LOTRISONE) cream Apply topically 2 (two) times daily.  . ferrous sulfate 325 (65 FE) MG tablet Take 325 mg by mouth daily with breakfast.  . Multiple Vitamin (MULTIVITAMIN) tablet Take 1 tablet by mouth daily.  . sertraline (ZOLOFT) 25 MG tablet Take 1 tablet (25 mg total) by mouth at bedtime.   No facility-administered encounter medications on file as of 04/28/2017.     Self-harm Behaviors Risk Assessment Self-harm risk factors: Self-harm risk factors: (None Reported ) Patient endorses recent thoughts of harming self: Have you recently had any thoughts about harming yourself?: No  [place] Suicide Severity Rating Scale: No flowsheet data found.  Danger to Others Risk Assessment Danger to others risk factors: Danger to Others Risk Factors: No risk  factors noted Patient endorses recent thoughts of harming others: Notification required: No need or identified person None  Reported    Goals, Interventions and Follow-up Plan Goals: Increase healthy adjustment to current life circumstances Interventions: Motivational Interviewing and Supportive Counseling Follow-up Plan: [facility] Phone Follow Up   Summary of Clinical Assessment  Patient is a 72 year old female that reports increased depression associated with the death of her mother in April 2018.    Patient was prescribed depression medication by her PCP but has not picked up the prescription and does not remember the name of the medication.  Patient denies taking any psychiatric medication in the past.    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  Patient reports that she enjoys working in the yard.   Patient reports that she is a widow and her husband died in 2003.   Amber Fernandez, Amber Fernandez, LCAS-A

## 2017-04-30 ENCOUNTER — Encounter: Payer: Self-pay | Admitting: Family Medicine

## 2017-04-30 DIAGNOSIS — F4321 Adjustment disorder with depressed mood: Secondary | ICD-10-CM | POA: Insufficient documentation

## 2017-04-30 DIAGNOSIS — F320 Major depressive disorder, single episode, mild: Secondary | ICD-10-CM | POA: Insufficient documentation

## 2017-04-30 NOTE — Assessment & Plan Note (Signed)
After obtaining informed consent, the vaccine is  administered by LPN.  

## 2017-04-30 NOTE — Assessment & Plan Note (Signed)
Reports  crying frequently over her Mother' passing more than 8 months since her passing , and her Mother was very ill when she dies and well into her 4190's  Feels she will benefit from therapy

## 2017-04-30 NOTE — Assessment & Plan Note (Signed)

## 2017-04-30 NOTE — Progress Notes (Signed)
    Amber EvesJanice M Florio     MRN: [id number]      DOB: 11/24/1945  HPI: Patient is in for annual physical exam. C/o depression and social withdrawal following the passing of her mother 1 year ago, and poor relationships with 2 of her siblings,  Not suicidal or homicidal Recent labs,  are reviewed. Immunization is reviewed ,she will check on shingrix coverage. TdAP is updated   PE: BP 138/80   Pulse 89   Resp 16   Ht 4\' 11"  (1.499 m)   Wt 157 lb (71.2 kg)   SpO2 97%   BMI 31.71 kg/m   Pleasant  female, alert and oriented x 3, in no cardio-pulmonary distress. Afebrile. HEENT No facial trauma or asymetry. Sinuses non tender.  Extra occullar muscles intact, pupils equally reactive to light. External ears normal, tympanic membranes clear. Oropharynx moist, no exudate. Neck: supple, no adenopathy,JVD or thyromegaly.No bruits.  Chest: Clear to ascultation bilaterally.No crackles or wheezes. Non tender to palpation  Breast: No asymetry,no masses or lumps. No tenderness. No nipple discharge or inversion. No axillary or supraclavicular adenopathy  Cardiovascular system; Heart sounds normal,  S1 and  S2 ,no S3.  No murmur, or thrill. Apical beat not displaced Peripheral pulses normal.  Abdomen: Soft, non tender, no organomegaly or masses. No bruits. Bowel sounds normal. No guarding, tenderness or rebound.  Rectal:  Pt to return 3 stool cards  GU: Asymptomatic, no exam done  Musculoskeletal exam: Full ROM of spine, hips , shoulders and reduced in knees.  deformity ,swelling and  crepitus noted.in knees No muscle wasting or atrophy.   Neurologic: Cranial nerves 2 to 12 intact. Power, tone ,sensation and reflexes normal throughout. No disturbance in gait. No tremor.  Skin: Intact, no ulceration, erythema , scaling or rash noted. Pigmentation normal throughout  Psych; Tearful and  depressed mood  Developed during the exam, and patient visibly sobbed and cried while  speaking of missing her Mother and current relationships with her sibst. Judgement and concentration normal   Assessment & Plan:  Annual physical exam Annual exam as documented. Counseling done  re healthy lifestyle involving commitment to 150 minutes exercise per week, heart healthy diet, and attaining healthy weight.The importance of adequate sleep also discussed. Regular seat belt use and home safety, is also discussed. Changes in health habits are decided on by the patient with goals and time frames  set for achieving them. Immunization and cancer screening needs are specifically addressed at this visit.   Depression, major, single episode, mild (HCC) Start zoloft , has been treated in the past for mental health , refer for tele psych, she is intersted  Unresolved grief Reports  crying frequently over her Mother' passing more than 8 months since her passing , and her Mother was very ill when she dies and well into her 1490's  Feels she will benefit from therapy  Need for Tdap vaccination After obtaining informed consent, the vaccine is  administered by LPN.

## 2017-04-30 NOTE — Assessment & Plan Note (Signed)
Start zoloft , has been treated in the past for mental health , refer for tele psych, she is intersted

## 2017-05-02 ENCOUNTER — Encounter (HOSPITAL_COMMUNITY): Payer: Self-pay | Admitting: Psychiatry

## 2017-05-04 ENCOUNTER — Telehealth: Payer: Self-pay

## 2017-05-04 DIAGNOSIS — F320 Major depressive disorder, single episode, mild: Secondary | ICD-10-CM

## 2017-05-04 NOTE — Progress Notes (Signed)
[facility] ([facility]) Psychiatric Consultant Case Review   Summary Amber EvesJanice M Fernandez is a 72 y.o. year old female with history of depression,  Osteoarthritis. She has had worsening depression associated with the death of her mother in April 2018. 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 aspirin 81 MG tablet Take 81 mg by mouth daily.      . Calcium Carbonate-Vitamin D (CALCIUM 500 + D) 500-125 MG-UNIT TABS Take by mouth. 1200mg     . clotrimazole-betamethasone (LOTRISONE) cream Apply topically 2 (two) times daily. 45 g 1  . ferrous sulfate 325 (65 FE) MG tablet Take 325 mg by mouth daily with breakfast.    . Multiple Vitamin (MULTIVITAMIN) tablet Take 1 tablet by mouth daily.    . sertraline (ZOLOFT) 25 MG tablet Take 1 tablet (25 mg total) by mouth at bedtime. 30 tablet 5   No current facility-administered medications on file prior to visit.      Past psychiatry history Outpatient: therapy Psychiatry admission: denies Previous suicide attempt: denies Past trials of medication: sertraline History of violence: denies  Current measures [facility] 04/28/2017 04/27/2017 04/05/2017 09/09/2015 05/14/2014  Decreased Interest 1 3 1  0 0  Down, Depressed, Hopeless 1 2 2  0 0  PHQ - 2 Score 2 5 3  0 0  Altered sleeping 2 2 1  - 2  Tired, decreased energy 1 1 1  - 3  Change in appetite 1 3 0 - 1  Feeling bad or failure about yourself  3 3 0 - 0  Trouble concentrating 1 0 0 - 0  Moving slowly or fidgety/restless 0 3 0 - 0  Suicidal thoughts 0 0 0 - 0  PHQ-9 Score 10 17 5  - 6  Difficult doing work/chores - - Somewhat difficult - Somewhat difficult    GAD 7 : Generalized Anxiety Score 04/28/2017  Nervous, Anxious, on Edge 0  Control/stop worrying 0  Worry too much - different things 0  Trouble relaxing 0  Restless 0  Easily annoyed or irritable 0  Afraid - awful might happen 0  Total GAD 7 Score 0   Lab Results  Component Value Date   TSH 1.70 04/20/2017    Goals (patient centered) Increase healthy adjustment to current life circumstances   Assessment/Provisional Diagnosis # MDD Start/continue sertraline with uptitration as indicated.   Recommendation - Continue sertraline 25 mg daily. Uptitrate as indicated. Targeted dose 50-150 mg per day.  - BH specialist to do grief therapy, work on behavioral activation  Thank you for your consult. We will continue to follow the patient. Please contact [facility]  for any questions or concerns.   The above treatment considerations and suggestions are based on [facility] and/or PCP and a review of information available in the shared registry and the patient's Electronic Health Record (EHR). I have not personally examined the patient. All recommendations should be implemented with consideration of the patient's relevant prior history and current clinical status. Please feel free to call me with any questions about the care of this patient.

## 2017-05-04 NOTE — BH Specialist Note (Signed)
Virtual Behavioral Health Treatment Plan Team Note  MRN: [id number] NAME: Amber Fernandez  DATE: 05/04/17   Total time: 15 minutes Total number of Virtual BH Treatment Team Plan encounters: 1/4  Treatment Team Attendees: Phillip Heal and Dr. Vanetta Shawl   Screenings PHQ-9 Assessments:  Depression screen [facility] 2/9 04/28/2017 04/27/2017 04/05/2017  Decreased Interest 1 3 1   Down, Depressed, Hopeless 1 2 2   PHQ - 2 Score 2 5 3   Altered sleeping 2 2 1   Tired, decreased energy 1 1 1   Change in appetite 1 3 0  Feeling bad or failure about yourself  3 3 0  Trouble concentrating 1 0 0  Moving slowly or fidgety/restless 0 3 0  Suicidal thoughts 0 0 0  PHQ-9 Score 10 17 5   Difficult doing work/chores - - Somewhat difficult   GAD-7 Assessments:  GAD 7 : Generalized Anxiety Score 04/28/2017  Nervous, Anxious, on Edge 0  Control/stop worrying 0  Worry too much - different things 0  Trouble relaxing 0  Restless 0  Easily annoyed or irritable 0  Afraid - awful might happen 0  Total GAD 7 Score 0    Presenting Problem/Current Symptoms: Depression  Grief  Diagnoses:    ICD-10-CM   1. Depression, major, single episode, mild (HCC) F32.0     Psychiatric History  Past Psychiatric History/Hospitalization(s): Anxiety: No Bipolar Disorder: No Depression: Yes Mania: No Psychosis: No Schizophrenia: No Personality Disorder: No Hospitalization for psychiatric illness: No History of Electroconvulsive Shock Therapy: No Prior Suicide Attempts: No   Stress   Virtual BH Phone Follow Up from 04/28/2017 in [facility]  Current Stressors  Grief/losses, Family death [Mother died in 05-31-2016]  Familial Stressors  None  Sleep  No problems  Appetite  Decreased, Loss of appetite  Coping ability  Normal  Patient taking medications as prescribed  Yes [Recently prescribed medication - She will pick up her prescription tomorrow. ]        Self-harm Behaviors Risk Assessment   Virtual  BH Phone Follow Up from 04/28/2017 in [facility]  Self-harm risk factors  -- [None Reported ]  Have you recently had any thoughts about harming yourself?  No       Allergies:  Allergies as of 05/04/2017 - Review Complete 04/30/2017  Allergen Reaction Noted  . Codeine Nausea And Vomiting and Rash    Medication History Current medications:  Outpatient Encounter Medications as of 05/04/2017  Medication Sig  . aspirin 81 MG tablet Take 81 mg by mouth daily.    . Calcium Carbonate-Vitamin D (CALCIUM 500 + D) 500-125 MG-UNIT TABS Take by mouth. 1200mg   . clotrimazole-betamethasone (LOTRISONE) cream Apply topically 2 (two) times daily.  . ferrous sulfate 325 (65 FE) MG tablet Take 325 mg by mouth daily with breakfast.  . Multiple Vitamin (MULTIVITAMIN) tablet Take 1 tablet by mouth daily.  . sertraline (ZOLOFT) 25 MG tablet Take 1 tablet (25 mg total) by mouth at bedtime.   No facility-administered encounter medications on file as of 05/04/2017.     Psychotropic Medication Management:   1. Medication: See Addendum   Indication:   Date started:   Date(s) changed:   Taking as prescribed:   Positive effects/relief of symptoms:   Negative side effects:   Medication Management Recommendations: See Addendum   Goals, Interventions and Follow-up Plan Goals: Increase healthy adjustment to current life circumstances Interventions: Motivational Interviewing Supportive Counseling Follow-up Plan: [facility] Phone Follow Up   Scribe for  Treatment Team: Phillip HealStevenson, Ancil Dewan LaVerne, LCAS-A

## 2017-05-05 NOTE — Telephone Encounter (Signed)
This encounter was created in error - please disregard.

## 2017-05-12 ENCOUNTER — Telehealth (INDEPENDENT_AMBULATORY_CARE_PROVIDER_SITE_OTHER): Payer: BLUE CROSS/BLUE SHIELD | Admitting: Family Medicine

## 2017-05-12 DIAGNOSIS — Z1211 Encounter for screening for malignant neoplasm of colon: Secondary | ICD-10-CM

## 2017-05-12 LAB — HEMOCCULT GUIAC POC 1CARD (OFFICE)
Card #2 Fecal Occult Blod, POC: NEGATIVE
Card #3 Fecal Occult Blood, POC: NEGATIVE
Fecal Occult Blood, POC: NEGATIVE

## 2017-05-12 NOTE — Telephone Encounter (Signed)
Patient dropped off FOB cards.

## 2017-05-23 ENCOUNTER — Telehealth: Payer: Self-pay

## 2017-05-23 ENCOUNTER — Other Ambulatory Visit: Payer: Self-pay

## 2017-05-23 MED ORDER — SERTRALINE HCL 25 MG PO TABS: 25.0000 mg | ORAL_TABLET | Freq: Every day | ORAL | 1 refills | Status: DC

## 2017-05-23 NOTE — Telephone Encounter (Signed)
[facility] - left message informing patient that I would be out of the office from May 2 to 28 and Jasmine or Erskine SquibbJane will be following up with her until I return.

## 2017-07-07 ENCOUNTER — Telehealth: Payer: Self-pay | Admitting: Clinical

## 2017-07-07 NOTE — Telephone Encounter (Signed)
[place] [facility] BH Follow Up Assessment ° °This BH intern left message to call back with name and contact information. ° °Sudheera Ranaweera °Behavioral Health Intern  °

## 2017-07-12 ENCOUNTER — Telehealth: Payer: Self-pay | Admitting: Family Medicine

## 2017-07-12 DIAGNOSIS — R928 Other abnormal and inconclusive findings on diagnostic imaging of breast: Secondary | ICD-10-CM

## 2017-07-12 NOTE — Addendum Note (Signed)
Addended by: Abner GreenspanHUDY, BRANDI H on: 07/12/2017 03:05 PM   Modules accepted: Orders

## 2017-07-12 NOTE — Telephone Encounter (Signed)
Pt would like a Bilateral Mammogram ordered at [facility].

## 2017-07-12 NOTE — Telephone Encounter (Signed)
mammo order placed

## 2017-07-13 NOTE — Telephone Encounter (Signed)
Mammogram is correct--- please add Right and Left Breast limited ultrasound....  ALSO --Change the DX to Follow up RIGHT Breast Lump

## 2017-07-13 NOTE — Telephone Encounter (Signed)
Done

## 2017-07-13 NOTE — Addendum Note (Signed)
Addended by: Abner GreenspanHUDY, Carmino Ocain H on: 07/13/2017 02:09 PM   Modules accepted: Orders

## 2017-08-10 ENCOUNTER — Ambulatory Visit (INDEPENDENT_AMBULATORY_CARE_PROVIDER_SITE_OTHER): Payer: BLUE CROSS/BLUE SHIELD | Admitting: Family Medicine

## 2017-08-10 ENCOUNTER — Encounter: Payer: Self-pay | Admitting: Family Medicine

## 2017-08-10 ENCOUNTER — Other Ambulatory Visit: Payer: Self-pay

## 2017-08-10 VITALS — BP 132/76 | HR 76 | Resp 14 | Ht 59.0 in | Wt 159.1 lb

## 2017-08-10 DIAGNOSIS — F320 Major depressive disorder, single episode, mild: Secondary | ICD-10-CM | POA: Diagnosis not present

## 2017-08-10 DIAGNOSIS — Z23 Encounter for immunization: Secondary | ICD-10-CM

## 2017-08-10 DIAGNOSIS — R7303 Prediabetes: Secondary | ICD-10-CM | POA: Diagnosis not present

## 2017-08-10 MED ORDER — SERTRALINE HCL 25 MG PO TABS: 25.0000 mg | ORAL_TABLET | Freq: Every day | ORAL | 5 refills | Status: DC

## 2017-08-10 MED ORDER — CLOTRIMAZOLE-BETAMETHASONE 1-0.05 % EX CREA: TOPICAL_CREAM | Freq: Two times a day (BID) | CUTANEOUS | 1 refills | Status: DC

## 2017-08-10 NOTE — Patient Instructions (Signed)
F/U in 3 months, call if you need me before  Please taoer the zoloft as follows take one tablet every other day for 2 weeks , then one tablet 3 times weekly for the following 2 weeks. Thankful you are much better  Feel free to contact tele psych as discussed with you  Cream uis prescribed  Shingrix  #1 today

## 2017-08-11 ENCOUNTER — Encounter: Payer: Self-pay | Admitting: Family Medicine

## 2017-08-11 DIAGNOSIS — Z23 Encounter for immunization: Secondary | ICD-10-CM | POA: Insufficient documentation

## 2017-08-11 NOTE — Assessment & Plan Note (Addendum)
Marked improvement on current medication and  With therapy, she is to continue medication for an additional 3 months and may start tapering toward the end of the 3 months. Does report difficulty on the job with a few people still, no mention of problems with family members

## 2017-08-11 NOTE — Assessment & Plan Note (Signed)
Shingrix vaccine #1 administered by nurse after informed consent given, no adverse effects noted

## 2017-08-11 NOTE — Assessment & Plan Note (Signed)
Deteriorated. Patient re-educated about  the importance of commitment to a  minimum of 150 minutes of exercise per week.  The importance of healthy food choices with portion control discussed. Encouraged to start a food diary, count calories and to consider  joining a support group. Sample diet sheets offered. Goals set by the patient for the next several months.   Weight /BMI 08/10/2017 04/27/2017 04/05/2017  WEIGHT 159 lb 1.9 oz 157 lb 159 lb  HEIGHT 4\' 11"  4\' 11"  4\' 11"   BMI 32.14 kg/m2 31.71 kg/m2 32.11 kg/m2

## 2017-08-11 NOTE — Assessment & Plan Note (Signed)
Patient educated about the importance of limiting  Carbohydrate intake , the need to commit to daily physical activity for a minimum of 30 minutes , and to commit weight loss. The fact that changes in all these areas will reduce or eliminate all together the development of diabetes is stressed.   Diabetic Labs Latest Ref Rng & Units 04/20/2017 03/31/2016 04/02/2015 05/14/2014 08/29/2013  HbA1c <5.7 % of total Hgb 5.9(H) 5.6 5.9(H) 6.0(H) 6.0(H)  Chol <200 mg/dL [id number] 045190 [id number] -  HDL >50 mg/dL 62 66 79 75 -  Calc LDL mg/dL (calc) 98 90 96 95 -  Triglycerides <150 mg/dL 75 77 73 75 -  Creatinine 0.50 - 0.99 mg/dL 8.110.82 9.140.88 7.820.89 9.560.80 2.130.88   BP/Weight 08/10/2017 04/27/2017 04/05/2017 06/16/2016 04/12/2016 09/09/2015 04/08/2015  Systolic BP 132 138 170 138 138 138 124  Diastolic BP 76 80 70 63 80 80 82  Wt. (Lbs) 159.12 157 159 155 157 159.4 154  BMI 32.14 31.71 32.11 30.27 29.66 30.12 29.11   No flowsheet data found.

## 2017-08-11 NOTE — Progress Notes (Signed)
Amber EvesJanice M Fernandez     MRN: [id number]      DOB: 07/16/1945   HPI Amber Fernandez is here for follow up and re-evaluation of chronic medical conditions, medication management and review of any available recent lab and radiology data.  Preventive health is updated, specifically  Cancer screening and Immunization.   Questions or concerns regarding consultations or procedures which the PT has had in the interim are  Addressed. States she definitely benefited from psychotherapy The PT denies any adverse reactions to current medications since the last visit.  There are no new concerns.  There are no specific complaints   ROS Denies recent fever or chills. Denies sinus pressure, nasal congestion, ear pain or sore throat. Denies chest congestion, productive cough or wheezing. Denies chest pains, palpitations and leg swelling Denies abdominal pain, nausea, vomiting,diarrhea or constipation.   Denies dysuria, frequency, hesitancy or incontinence. Denies joint pain, swelling and limitation in mobility. Denies headaches, seizures, numbness, or tingling. Denies uncontrolled depression, anxiety or insomnia. Denies skin break down or rash.   PE  BP 132/76 (BP Location: Left Arm, Patient Position: Sitting, Cuff Size: Normal)   Pulse 76   Resp 14   Ht 4\' 11"  (1.499 m)   Wt 159 lb 1.9 oz (72.2 kg)   SpO2 96% Comment: room air  BMI 32.14 kg/m   Patient alert and oriented and in no cardiopulmonary distress.  HEENT: No facial asymmetry, EOMI,   oropharynx pink and moist.  Neck supple no JVD, no mass.  Chest: Clear to auscultation bilaterally.  CVS: S1, S2 no murmurs, no S3.Regular rate.  ABD: Soft non tender.   Ext: No edema  MS: Adequate ROM spine, shoulders, hips and knees.  Skin: Intact, no ulcerations or rash noted.  Psych: Good eye contact, normal affect. Memory intact not anxious or depressed appearing.  CNS: CN 2-12 intact, power,  normal throughout.no focal deficits  noted.   Assessment & Plan  Depression, major, single episode, mild (HCC) Marked improvement on current medication and  With therapy, she is to continue medication for an additional 3 months and may start tapering toward the end of the 3 months. Does report difficulty on the job with a few people still, no mention of problems with family members  OBESITY Deteriorated. Patient re-educated about  the importance of commitment to a  minimum of 150 minutes of exercise per week.  The importance of healthy food choices with portion control discussed. Encouraged to start a food diary, count calories and to consider  joining a support group. Sample diet sheets offered. Goals set by the patient for the next several months.   Weight /BMI 08/10/2017 04/27/2017 04/05/2017  WEIGHT 159 lb 1.9 oz 157 lb 159 lb  HEIGHT 4\' 11"  4\' 11"  4\' 11"   BMI 32.14 kg/m2 31.71 kg/m2 32.11 kg/m2      Prediabetes Patient educated about the importance of limiting  Carbohydrate intake , the need to commit to daily physical activity for a minimum of 30 minutes , and to commit weight loss. The fact that changes in all these areas will reduce or eliminate all together the development of diabetes is stressed.   Diabetic Labs Latest Ref Rng & Units 04/20/2017 03/31/2016 04/02/2015 05/14/2014 08/29/2013  HbA1c <5.7 % of total Hgb 5.9(H) 5.6 5.9(H) 6.0(H) 6.0(H)  Chol <200 mg/dL 086176 578171 [id number] -  HDL >50 mg/dL 62 66 79 75 -  Calc LDL mg/dL (calc) 98 90 96 95 -  Triglycerides <150  mg/dL 75 77 73 75 -  Creatinine 0.50 - 0.99 mg/dL 2.95 6.21 3.08 6.57 8.46   BP/Weight 08/10/2017 04/27/2017 04/05/2017 06/16/2016 04/12/2016 09/09/2015 04/08/2015  Systolic BP 132 138 170 138 138 138 124  Diastolic BP 76 80 70 63 80 80 82  Wt. (Lbs) 159.12 157 159 155 157 159.4 154  BMI 32.14 31.71 32.11 30.27 29.66 30.12 29.11   No flowsheet data found.    Need for shingles vaccine Shingrix vaccine #1 administered by nurse after informed consent given,  no adverse effects noted

## 2017-08-15 ENCOUNTER — Ambulatory Visit (HOSPITAL_COMMUNITY)
Admission: RE | Admit: 2017-08-15 | Discharge: 2017-08-15 | Disposition: A | Discharge status: Home / Self Care | Payer: BLUE CROSS/BLUE SHIELD | Source: Ambulatory Visit | Attending: Family Medicine | Admitting: Family Medicine

## 2017-08-15 ENCOUNTER — Encounter (HOSPITAL_COMMUNITY): Payer: Self-pay

## 2017-08-15 ENCOUNTER — Encounter (HOSPITAL_COMMUNITY): Payer: BLUE CROSS/BLUE SHIELD

## 2017-08-15 DIAGNOSIS — R928 Other abnormal and inconclusive findings on diagnostic imaging of breast: Secondary | ICD-10-CM

## 2017-08-15 DIAGNOSIS — N6310 Unspecified lump in the right breast, unspecified quadrant: Secondary | ICD-10-CM | POA: Diagnosis not present

## 2017-09-07 ENCOUNTER — Telehealth: Payer: Self-pay

## 2017-09-07 DIAGNOSIS — Z6832 Body mass index (BMI) 32.0-32.9, adult: Secondary | ICD-10-CM | POA: Diagnosis not present

## 2017-09-07 DIAGNOSIS — R200 Anesthesia of skin: Secondary | ICD-10-CM | POA: Diagnosis not present

## 2017-09-07 DIAGNOSIS — M5030 Other cervical disc degeneration, unspecified cervical region: Secondary | ICD-10-CM | POA: Diagnosis not present

## 2017-09-07 DIAGNOSIS — R202 Paresthesia of skin: Secondary | ICD-10-CM | POA: Diagnosis not present

## 2017-09-07 DIAGNOSIS — M5412 Radiculopathy, cervical region: Secondary | ICD-10-CM | POA: Diagnosis not present

## 2017-09-07 DIAGNOSIS — F320 Major depressive disorder, single episode, mild: Secondary | ICD-10-CM

## 2017-09-07 NOTE — BH Specialist Note (Signed)
[facility] Telephone Follow-up  MRN: [id number] NAME: Amber Fernandez Date: 2017-09-24   Total time: 30 minutes Call number: 2/6  Reason for call today: Reason for Contact: PHQ9-2 weeks  PHQ-9 Scores:  Depression screen [facility] 2/9 24-Sep-2017 08/10/2017 08/10/2017 04/28/2017 04/27/2017  Decreased Interest 0 0 0 1 3  Down, Depressed, Hopeless 1 0 0 1 2  PHQ - 2 Score 1 0 0 2 5  Altered sleeping 0 0 - 2 2  Tired, decreased energy 0 1 - 1 1  Change in appetite 0 0 - 1 3  Feeling bad or failure about yourself  0 0 - 3 3  Trouble concentrating 0 0 - 1 0  Moving slowly or fidgety/restless 0 0 - 0 3  Suicidal thoughts - 0 - 0 0  PHQ-9 Score 1 1 - 10 17  Difficult doing work/chores Not difficult at all Somewhat difficult - - -     GAD-7 Scores:  GAD 7 : Generalized Anxiety Score 24-Sep-2017 04/28/2017  Nervous, Anxious, on Edge 0 0  Control/stop worrying 0 0  Worry too much - different things 0 0  Trouble relaxing 0 0  Restless 0 0  Easily annoyed or irritable 2 0  Afraid - awful might happen 0 0  Total GAD 7 Score 2 0    Stress Current stressors: Current Stressors: (None Reported) Sleep: Sleep: No problems Appetite: Appetite: No problems Coping ability: Coping ability: Normal  Patient taking medications as prescribed: Patient taking medications as prescribed: No prescribed medications  Current medications:  Outpatient Encounter Medications as of 24-Sep-2017  Medication Sig  . aspirin 81 MG tablet Take 81 mg by mouth daily.    . Calcium Carbonate-Vitamin D (CALCIUM 500 + D) 500-125 MG-UNIT TABS Take by mouth. 1200mg   . clotrimazole-betamethasone (LOTRISONE) cream Apply topically 2 (two) times daily.  . ferrous sulfate 325 (65 FE) MG tablet Take 325 mg by mouth daily with breakfast.  . Multiple Vitamin (MULTIVITAMIN) tablet Take 1 tablet by mouth daily.  . sertraline (ZOLOFT) 25 MG tablet Take 1 tablet (25 mg total) by mouth daily.   No facility-administered encounter  medications on file as of 09/24/17.      Self-harm Behaviors Risk Assessment Self-harm risk factors: Self-harm risk factors: Acts of self-harm Patient endorses recent thoughts of harming self: Have you recently had any thoughts about harming yourself?: No  [place] Suicide Severity Rating Scale: No flowsheet data found. C-SRSS Sep 24, 2017  1. Wish to be Dead No  2. Suicidal Thoughts No  6. Suicide Behavior Question No     Danger to Others Risk Assessment Danger to others risk factors: Danger to Others Risk Factors: No risk factors noted Patient endorses recent thoughts of harming others: Notification required: No need or identified person    Substance Use Assessment Patient recently consumed alcohol: Have you recently consumed alcohol?: No  Alcohol Use Disorder Identification Test (AUDIT):  Alcohol Use Disorder Test (AUDIT) 09-24-17  1. How often do you have a drink containing alcohol? 0  2. How many drinks containing alcohol do you have on a typical day when you are drinking? 0  3. How often do you have six or more drinks on one occasion? 0  AUDIT-C Score 0  Intervention/Follow-up AUDIT Score <7 follow-up not indicated   Patient recently used drugs: Have you recently used any drugs?: No    Goals, Interventions and Follow-up Plan Goals: Increase healthy adjustment to current life circumstances Interventions: Mindfulness or Relaxation Training and  Supportive Counseling Follow-up Plan: [facility] Phone Follow Up   Summary:   Follow Up - Patient continues to have a lowered PHQ and GAD score.  Patient reports that her mood has improved and the psychiatric medication is working well for her.  Patient denies any problems with sleep or appetite.   Patient continues to work two part-time jobs.  Patient reports that she enjoys working and taking care of others.  Writer discussed how important taking time for herself and not spend all of her time at work.    During the next session patient  will discuss the time that she spend for herself by gardening. Elisabeth Most.   Myley Bahner LaVerne, LCAS-A

## 2017-09-15 ENCOUNTER — Telehealth: Payer: Self-pay | Admitting: Family Medicine

## 2017-09-15 NOTE — Telephone Encounter (Signed)
pls call pt on her cell , I just spoke with her. Needs f/u appt from recent urgent care visit, neck and arm pain and hyperttension

## 2017-09-15 NOTE — Telephone Encounter (Signed)
Done

## 2017-09-19 ENCOUNTER — Encounter: Payer: Self-pay | Admitting: Family Medicine

## 2017-09-19 ENCOUNTER — Ambulatory Visit (INDEPENDENT_AMBULATORY_CARE_PROVIDER_SITE_OTHER): Payer: BLUE CROSS/BLUE SHIELD | Admitting: Family Medicine

## 2017-09-19 VITALS — BP 138/74 | HR 83 | Resp 16 | Ht 59.0 in | Wt 160.0 lb

## 2017-09-19 DIAGNOSIS — E6609 Other obesity due to excess calories: Secondary | ICD-10-CM | POA: Diagnosis not present

## 2017-09-19 DIAGNOSIS — M542 Cervicalgia: Secondary | ICD-10-CM

## 2017-09-19 DIAGNOSIS — F320 Major depressive disorder, single episode, mild: Secondary | ICD-10-CM

## 2017-09-19 DIAGNOSIS — Z23 Encounter for immunization: Secondary | ICD-10-CM | POA: Diagnosis not present

## 2017-09-19 DIAGNOSIS — Z6830 Body mass index (BMI) 30.0-30.9, adult: Secondary | ICD-10-CM

## 2017-09-19 NOTE — Assessment & Plan Note (Signed)
After obtaining informed consent, the vaccine is  administered by LPN.  

## 2017-09-19 NOTE — Patient Instructions (Addendum)
F/u as before call if you need me before  Flu vaccine  Today  You will be referred for MRI neck , since yourleft wrist grip is weak an you still have abnormal sensation in your arm and neck , we will call with appt ( not Monday and after `1 pm)

## 2017-09-19 NOTE — Assessment & Plan Note (Addendum)
Acute left neck pain  x 2.5 weeks, intermittent tingling and weak left grip, needs MRI, grip is grade 3 in left hand and pt has reduced sensation in LUE

## 2017-09-19 NOTE — Progress Notes (Signed)
   Amber EvesJanice M Fernandez     MRN: [id number]      DOB: 04/11/1945   HPI Amber Fernandez is here for follow up from UC visit for neck and and RUE pain and tingling which  Radiated to left elbow,  Pain was initially a 10 when she went to UC , she can identify no aggravating factor , it had started suddenly 5 days before At its worst , no shoulder movement possible, now  Grip in left hand is 3   ROS Denies recent fever or chills. Denies sinus pressure, nasal congestion, ear pain or sore throat. Denies chest congestion, productive cough or wheezing. Denies chest pains, palpitations and leg swelling Denies abdominal pain, nausea, vomiting,diarrhea or constipation.   Denies dysuria, frequency, hesitancy or incontinence.  Denies uncontrolled depression, anxiety or insomnia. Denies skin break down or rash.   PE  BP 138/74   Pulse 83   Resp 16   Ht 4\' 11"  (1.499 m)   Wt 160 lb (72.6 kg)   SpO2 98%   BMI 32.32 kg/m   Patient alert and oriented and in no cardiopulmonary distress.  HEENT: No facial asymmetry, EOMI,   oropharynx pink and moist.  Neck decreased ROM no JVD, no mass.  Chest: Clear to auscultation bilaterally.  CVS: S1, S2 no murmurs, no S3.Regular rate.  ABD: Soft non tender.   Ext: No edema  MS: Adequate ROM spine, shoulders, hips and knees.  Skin: Intact, no ulcerations or rash noted.  Psych: Good eye contact, normal affect. Memory intact not anxious or depressed appearing.  CNS: CN 2-12 intact, grade 3 power in left  hand and reduced sensation in LUE   Assessment & Plan  Neck pain on left side Acute left neck pain  x 2.5 weeks, intermittent tingling and weak left grip, needs MRI, grip is grade 3 in left hand and pt has reduced sensation in LUE  Need for immunization against influenza After obtaining informed consent, the vaccine is  administered by LPN.   OBESITY Deteriorated. Patient re-educated about  the importance of commitment to a  minimum of 150 minutes  of exercise per week.  The importance of healthy food choices with portion control discussed. Encouraged to start a food diary, count calories and to consider  joining a support group. Sample diet sheets offered. Goals set by the patient for the next several months.   Weight /BMI 09/19/2017 08/10/2017 04/27/2017  WEIGHT 160 lb 159 lb 1.9 oz 157 lb  HEIGHT 4\' 11"  4\' 11"  4\' 11"   BMI 32.32 kg/m2 32.14 kg/m2 31.71 kg/m2      Depression, major, single episode, mild (HCC) Improved on medciation

## 2017-09-20 NOTE — Progress Notes (Signed)
Improvement in PHQ9, GAD 7. No change in medication recommendation. Will discuss relapse prevention on next phone visit.   

## 2017-09-22 ENCOUNTER — Telehealth: Payer: Self-pay

## 2017-09-22 NOTE — Telephone Encounter (Signed)
Per Dr. Vanetta ShawlHisada - Discuss relapse prevention with this patient during the next session and [facility] with [facility]

## 2017-09-23 ENCOUNTER — Encounter: Payer: Self-pay | Admitting: Family Medicine

## 2017-09-23 NOTE — Addendum Note (Signed)
Addended by: Syliva OvermanSIMPSON, MARGARET E on: 09/23/2017 10:57 AM   Modules accepted: Orders

## 2017-09-23 NOTE — Assessment & Plan Note (Signed)
Improved on medciation

## 2017-09-23 NOTE — Assessment & Plan Note (Signed)
Deteriorated. Patient re-educated about  the importance of commitment to a  minimum of 150 minutes of exercise per week.  The importance of healthy food choices with portion control discussed. Encouraged to start a food diary, count calories and to consider  joining a support group. Sample diet sheets offered. Goals set by the patient for the next several months.   Weight /BMI 09/19/2017 08/10/2017 04/27/2017  WEIGHT 160 lb 159 lb 1.9 oz 157 lb  HEIGHT 4\' 11"  4\' 11"  4\' 11"   BMI 32.32 kg/m2 32.14 kg/m2 31.71 kg/m2

## 2017-09-29 ENCOUNTER — Telehealth: Payer: Self-pay | Admitting: Family Medicine

## 2017-09-29 NOTE — Telephone Encounter (Signed)
Pt called and said her arm is not getting any better and need MRI asap.

## 2017-10-03 NOTE — Telephone Encounter (Signed)
PLS SEE IF YOU CAN GET MRI APPROVED SO IT IS SCHEDULED

## 2017-10-05 NOTE — Telephone Encounter (Signed)
Scheduled and pt aware.

## 2017-10-11 ENCOUNTER — Telehealth: Payer: Self-pay

## 2017-10-11 ENCOUNTER — Ambulatory Visit (HOSPITAL_COMMUNITY)
Admission: RE | Admit: 2017-10-11 | Discharge: 2017-10-11 | Disposition: A | Discharge status: Home / Self Care | Payer: BLUE CROSS/BLUE SHIELD | Source: Ambulatory Visit | Attending: Family Medicine | Admitting: Family Medicine

## 2017-10-11 DIAGNOSIS — M4802 Spinal stenosis, cervical region: Secondary | ICD-10-CM | POA: Diagnosis not present

## 2017-10-11 DIAGNOSIS — M47812 Spondylosis without myelopathy or radiculopathy, cervical region: Secondary | ICD-10-CM | POA: Diagnosis not present

## 2017-10-11 DIAGNOSIS — M542 Cervicalgia: Secondary | ICD-10-CM | POA: Diagnosis not present

## 2017-10-11 NOTE — Telephone Encounter (Signed)
2nd attempt - [facility]   

## 2017-10-12 ENCOUNTER — Other Ambulatory Visit: Payer: Self-pay | Admitting: Family Medicine

## 2017-10-12 DIAGNOSIS — M4712 Other spondylosis with myelopathy, cervical region: Secondary | ICD-10-CM

## 2017-10-12 DIAGNOSIS — M4722 Other spondylosis with radiculopathy, cervical region: Principal | ICD-10-CM

## 2017-10-16 ENCOUNTER — Other Ambulatory Visit: Payer: Self-pay | Admitting: Family Medicine

## 2017-10-17 ENCOUNTER — Telehealth: Payer: Self-pay

## 2017-10-17 NOTE — Telephone Encounter (Signed)
3rd attempt

## 2017-10-18 ENCOUNTER — Telehealth: Payer: Self-pay | Admitting: Family Medicine

## 2017-10-18 NOTE — Telephone Encounter (Signed)
Patient is requesting a phone call to go over her recent imaging results. [phone]/ [phone]

## 2017-10-18 NOTE — Telephone Encounter (Signed)
Patient aware of her results 

## 2017-10-31 NOTE — Telephone Encounter (Signed)
Several attempts have been made to contact patient without success. Patient will be placed on the inactive list.  If services are needed again.  Please contact [facility] at [phone].    Information will be routed to the PCP and Dr. Hisada  

## 2017-11-15 ENCOUNTER — Ambulatory Visit (INDEPENDENT_AMBULATORY_CARE_PROVIDER_SITE_OTHER): Payer: BLUE CROSS/BLUE SHIELD | Admitting: Family Medicine

## 2017-11-15 VITALS — BP 130/70 | HR 72 | Resp 12 | Ht 59.0 in | Wt 162.0 lb

## 2017-11-15 DIAGNOSIS — E6609 Other obesity due to excess calories: Secondary | ICD-10-CM

## 2017-11-15 DIAGNOSIS — Z6832 Body mass index (BMI) 32.0-32.9, adult: Secondary | ICD-10-CM | POA: Diagnosis not present

## 2017-11-15 DIAGNOSIS — F320 Major depressive disorder, single episode, mild: Secondary | ICD-10-CM | POA: Diagnosis not present

## 2017-11-15 DIAGNOSIS — M542 Cervicalgia: Secondary | ICD-10-CM | POA: Diagnosis not present

## 2017-11-15 DIAGNOSIS — R7303 Prediabetes: Secondary | ICD-10-CM

## 2017-11-15 DIAGNOSIS — Z23 Encounter for immunization: Secondary | ICD-10-CM

## 2017-11-15 NOTE — Patient Instructions (Signed)
Annual Physical exam EARLY APRIL, CALL IF YOU NEED ME SOONER  STOP ZOLOFT ON COMPLETION OF CURRENT BOTTLE  WE WILL CALL YOU WITH APPT INFORMATION ABOUT THE NEUROSURGERY EVAL, CELL  [phone]  It is important that you exercise regularly at least 30 minutes 5 times a week. If you develop chest pain, have severe difficulty breathing, or feel very tired, stop exercising immediately and seek medical attention   Shingrix #2 today Please work on good  health habits so that your health will improve. 1. Commitment to daily physical activity for 30 to 60  minutes, if you are able to do this.  2. Commitment to wise food choices. Aim for half of your  food intake to be vegetable and fruit, one quarter starchy foods, and one quarter protein. Try to eat on a regular schedule  3 meals per day, snacking between meals should be limited to vegetables or fruits or small portions of nuts. 64 ounces of water per day is generally recommended, unless you have specific health conditions, like heart failure or kidney failure where you will need to limit fluid intake.  3. Commitment to sufficient and a  good quality of physical and mental rest daily, generally between 6 to 8 hours per day.  WITH PERSISTANCE AND PERSEVERANCE, THE IMPOSSIBLE , BECOMES THE NORM!

## 2017-11-15 NOTE — Progress Notes (Signed)
Amber Fernandez     MRN: [id number]      DOB: March 17, 1945   HPI Ms. Down is here for follow up and re-evaluation of chronic medical conditions, medication management and review of any available recent lab and radiology data.  Preventive health is updated, specifically  Cancer screening and Immunization.   Questions or concerns regarding consultations or procedures which the PT has had in the interim are  addressed. The PT denies any adverse reactions to current medications since the last visit.  States that her neck and LUE pain have improved, still has mild weakness in the left hand Will follow through with neurosurgeon  ROS Denies recent fever or chills. Denies sinus pressure, nasal congestion, ear pain or sore throat. Denies chest congestion, productive cough or wheezing. Denies chest pains, palpitations and leg swelling Denies abdominal pain, nausea, vomiting,diarrhea or constipation.   Denies dysuria, frequency, hesitancy or incontinence. Denies headaches, seizures, numbness, or tingling. Denies depression, anxiety or insomnia. Denies skin break down or rash.   PE BP 130/70   Pulse 72   Resp 12   Ht 4\' 11"  (1.499 m)   Wt 162 lb (73.5 kg)   SpO2 98%   BMI 32.72 kg/m     Patient alert and oriented and in no cardiopulmonary distress.  HEENT: No facial asymmetry, EOMI,   oropharynx pink and moist.  Neck decreased ROM no JVD, no mass.  Chest: Clear to auscultation bilaterally.  CVS: S1, S2 no murmurs, no S3.Regular rate.  ABD: Soft non tender.   Ext: No edema  MS: Adequate ROM spine, shoulders, hips and knees.  Skin: Intact, no ulcerations or rash noted.  Psych: Good eye contact, normal affect. Memory intact not anxious or depressed appearing.  CNS: CN 2-12 intact, power,  normal throughout.no focal deficits noted.   Assessment & Plan  Neck pain on left side Improved, but needs evaluation by neurosurgery, will follow through on referral info, cell # left  for pt to be contacted  Depression, major, single episode, mild (HCC) Markedly improved/ resolved, pt to discontinue medication on completion of current  Need for shingles vaccine After obtaining informed consent, the vaccine is  administered by LPN.   OBESITY Deteriorated. Patient re-educated about  the importance of commitment to a  minimum of 150 minutes of exercise per week.  The importance of healthy food choices with portion control discussed. Encouraged to start a food diary, count calories and to consider  joining a support group. Sample diet sheets offered. Goals set by the patient for the next several months.   Weight /BMI 11/15/2017 09/19/2017 08/10/2017  WEIGHT 162 lb 160 lb 159 lb 1.9 oz  HEIGHT 4\' 11"  4\' 11"  4\' 11"   BMI 32.72 kg/m2 32.32 kg/m2 32.14 kg/m2      Prediabetes Patient educated about the importance of limiting  Carbohydrate intake , the need to commit to daily physical activity for a minimum of 30 minutes , and to commit weight loss. The fact that changes in all these areas will reduce or eliminate all together the development of diabetes is stressed.   Updated lab needed at/ before next visit.  Diabetic Labs Latest Ref Rng & Units 04/20/2017 03/31/2016 04/02/2015 05/14/2014 08/29/2013  HbA1c <5.7 % of total Hgb 5.9(H) 5.6 5.9(H) 6.0(H) 6.0(H)  Chol <200 mg/dL 220 254 270 623 -  HDL >50 mg/dL 62 66 79 75 -  Calc LDL mg/dL (calc) 98 90 96 95 -  Triglycerides <150 mg/dL 75 77  73 75 -  Creatinine 0.50 - 0.99 mg/dL 1.61 0.96 0.45 4.09 8.11   BP/Weight 11/15/2017 09/19/2017 08/10/2017 04/27/2017 04/05/2017 06/16/2016 04/12/2016  Systolic BP 130 138 132 138 170 138 138  Diastolic BP 70 74 76 80 70 63 80  Wt. (Lbs) 162 160 159.12 157 159 155 157  BMI 32.72 32.32 32.14 31.71 32.11 30.27 29.66   No flowsheet data found.

## 2017-11-19 ENCOUNTER — Encounter: Payer: Self-pay | Admitting: Family Medicine

## 2017-11-19 NOTE — Assessment & Plan Note (Signed)
After obtaining informed consent, the vaccine is  administered by LPN.  

## 2017-11-19 NOTE — Assessment & Plan Note (Signed)
Patient educated about the importance of limiting  Carbohydrate intake , the need to commit to daily physical activity for a minimum of 30 minutes , and to commit weight loss. The fact that changes in all these areas will reduce or eliminate all together the development of diabetes is stressed.   Updated lab needed at/ before next visit.  Diabetic Labs Latest Ref Rng & Units 04/20/2017 03/31/2016 04/02/2015 05/14/2014 08/29/2013  HbA1c <5.7 % of total Hgb 5.9(H) 5.6 5.9(H) 6.0(H) 6.0(H)  Chol <200 mg/dL 161 096 045 409 -  HDL >50 mg/dL 62 66 79 75 -  Calc LDL mg/dL (calc) 98 90 96 95 -  Triglycerides <150 mg/dL 75 77 73 75 -  Creatinine 0.50 - 0.99 mg/dL 8.11 9.14 7.82 9.56 2.13   BP/Weight 11/15/2017 09/19/2017 08/10/2017 04/27/2017 04/05/2017 06/16/2016 04/12/2016  Systolic BP 130 138 132 138 170 138 138  Diastolic BP 70 74 76 80 70 63 80  Wt. (Lbs) 162 160 159.12 157 159 155 157  BMI 32.72 32.32 32.14 31.71 32.11 30.27 29.66   No flowsheet data found.

## 2017-11-19 NOTE — Assessment & Plan Note (Signed)
Deteriorated. Patient re-educated about  the importance of commitment to a  minimum of 150 minutes of exercise per week.  The importance of healthy food choices with portion control discussed. Encouraged to start a food diary, count calories and to consider  joining a support group. Sample diet sheets offered. Goals set by the patient for the next several months.   Weight /BMI 11/15/2017 09/19/2017 08/10/2017  WEIGHT 162 lb 160 lb 159 lb 1.9 oz  HEIGHT 4\' 11"  4\' 11"  4\' 11"   BMI 32.72 kg/m2 32.32 kg/m2 32.14 kg/m2

## 2017-11-19 NOTE — Assessment & Plan Note (Addendum)
Markedly improved/ resolved, pt to discontinue medication on completion of current

## 2017-11-19 NOTE — Assessment & Plan Note (Signed)
Improved, but needs evaluation by neurosurgery, will follow through on referral info, cell # left for pt to be contacted

## 2017-11-29 DIAGNOSIS — Z6833 Body mass index (BMI) 33.0-33.9, adult: Secondary | ICD-10-CM | POA: Diagnosis not present

## 2017-11-29 DIAGNOSIS — I10 Essential (primary) hypertension: Secondary | ICD-10-CM | POA: Diagnosis not present

## 2017-11-29 DIAGNOSIS — M5020 Other cervical disc displacement, unspecified cervical region: Secondary | ICD-10-CM | POA: Diagnosis not present

## 2018-04-27 ENCOUNTER — Telehealth: Payer: Self-pay | Admitting: *Deleted

## 2018-04-27 NOTE — Telephone Encounter (Signed)
Rescheduled CPE pt was wondering what needed to be done about paper for work if her CPE is not done before 4/29 then her insurance will go up at work. Told her I was going to reschedule and let Dr. Lodema Hong know and decide what needed to be done.

## 2018-04-30 NOTE — Telephone Encounter (Signed)
Please have her  Fax in the paperwork, or put under the door, often need blood work and a blood pressure , let's see

## 2018-04-30 NOTE — Telephone Encounter (Signed)
Called pt and let her know she stated that she would pick it up tonight and bring by the office as soon as she could.

## 2018-04-30 NOTE — Telephone Encounter (Signed)
See dr Lodema Hong response

## 2018-05-01 ENCOUNTER — Encounter: Payer: BLUE CROSS/BLUE SHIELD | Admitting: Family Medicine

## 2018-06-20 ENCOUNTER — Encounter: Payer: BLUE CROSS/BLUE SHIELD | Admitting: Family Medicine

## 2018-06-22 ENCOUNTER — Encounter: Payer: Self-pay | Admitting: Family Medicine

## 2018-06-22 ENCOUNTER — Other Ambulatory Visit: Payer: Self-pay

## 2018-06-22 ENCOUNTER — Encounter (INDEPENDENT_AMBULATORY_CARE_PROVIDER_SITE_OTHER): Payer: Self-pay

## 2018-06-22 ENCOUNTER — Ambulatory Visit (INDEPENDENT_AMBULATORY_CARE_PROVIDER_SITE_OTHER): Payer: BLUE CROSS/BLUE SHIELD | Admitting: Family Medicine

## 2018-06-22 VITALS — BP 136/80 | HR 94 | Resp 12 | Ht 59.0 in | Wt 158.0 lb

## 2018-06-22 DIAGNOSIS — E669 Obesity, unspecified: Secondary | ICD-10-CM

## 2018-06-22 DIAGNOSIS — Z0000 Encounter for general adult medical examination without abnormal findings: Secondary | ICD-10-CM

## 2018-06-22 DIAGNOSIS — L299 Pruritus, unspecified: Secondary | ICD-10-CM | POA: Diagnosis not present

## 2018-06-22 DIAGNOSIS — R7303 Prediabetes: Secondary | ICD-10-CM

## 2018-06-22 DIAGNOSIS — E559 Vitamin D deficiency, unspecified: Secondary | ICD-10-CM

## 2018-06-22 MED ORDER — CLOTRIMAZOLE-BETAMETHASONE 1-0.05 % EX CREA: 1.0000 "application " | TOPICAL_CREAM | Freq: Two times a day (BID) | CUTANEOUS | 0 refills | Status: DC

## 2018-06-22 NOTE — Patient Instructions (Addendum)
    Thank you for coming into the office today. I appreciate the opportunity to provide you with the care for your health and wellness. Today we discussed: overall health  Please get labs within the next week (fasting). We will call you and update you with results.  Continue to take such wonderful care of yourself.  Please continue to practice social distancing as we slowing open back up to protect you, your family, and our community. Wear a mask if you must work or go out. Please focus on good hand hygiene.  HEALTH MAINTENANCE RECOMMENDATIONS:  It is recommended that you get at least 30 minutes of aerobic exercise at least 5 days/week (for weight loss, you may need as much as 60-90 minutes). This can be any activity that gets your heart rate up. This can be divided in 10-15 minute intervals if needed, but try and build up your endurance at least once a week.  Weight bearing exercise is also recommended twice weekly.  Eat a healthy diet with lots of vegetables, fruits and fiber.  "Colorful" foods have a lot of vitamins (ie green vegetables, tomatoes, red peppers, etc).  Limit sweet tea, regular sodas and alcoholic beverages, all of which has a lot of calories and sugar.  Up to 1 alcoholic drink daily may be beneficial for women (unless trying to lose weight, watch sugars).  Drink a lot of water.  Calcium recommendations are 1200-1500 mg daily (1500 mg for postmenopausal women or women without ovaries), and vitamin D 1000 IU daily.  This should be obtained from diet and/or supplements (vitamins), and calcium should not be taken all at once, but in divided doses.  Monthly self breast exams and yearly mammograms for women over the age of 35 is recommended.  Sunscreen of at least SPF 30 should be used on all sun-exposed parts of the skin when outside between the hours of 10 am and 4 pm (not just when at beach or pool, but even with exercise, golf, tennis, and yard work!)  Use a sunscreen that says  "broad spectrum" so it covers both UVA and UVB rays, and make sure to reapply every 1-2 hours.  Remember to change the batteries in your smoke detectors when changing your clock times in the spring and fall.  Use your seat belt every time you are in a car, and please drive safely and not be distracted with cell phones and texting while driving.  WASH YOUR HANDS WELL AND FREQUENTLY. AVOID TOUCHING YOUR FACE, UNLESS YOUR HANDS ARE FRESHLY WASHED.  GET FRESH AIR DAILY. STAY HYDRATED WITH WATER.   It was a pleasure to see you and I look forward to continuing to work together on your health and well-being. Please do not hesitate to call the office if you need care or have questions about your care.  Have a wonderful day and week. With Gratitude, Tereasa Coop, DNP, AGNP-BC

## 2018-06-22 NOTE — Progress Notes (Signed)
Health Maintenance reviewed -  Immunization History  Administered Date(s) Administered  . Influenza,inj,Quad PF,6+ Mos 10/17/2012, 09/19/2017  . Pneumococcal Conjugate-13 05/14/2014  . Pneumococcal Polysaccharide-23 10/17/2012  . Td 05/26/2005  . Tdap 04/27/2017  . Zoster 04/07/2010  . Zoster Recombinat (Shingrix) 08/10/2017, 11/15/2017   Last Pap smear: 2018 Last mammogram: 2019 Last colonoscopy: 2015 Last DEXA: 2014 Dentist: Dentures Ophtho: Appt July 2020 Exercise: Walking daily and in gardening   Other doctors caring for patient include:  Patient Care Team: Kerri Perches, MD as PCP - General (Family Medicine)  End of Life Discussion:  Patient does not have a living will and medical power of attorney. Is working on it.  Code Status: In discussion today, she rather not have CPR    Subjective:   HPI  Amber Fernandez is a 73 y.o. female who presents for annual wellness visit and follow-up on chronic medical conditions.   Ms. Granger has no complaints today during her annual wellness visit.  She reports that she is doing so much better now since dealing with her mother's passing 2 years ago last month.  She reports that her son has left some cats behind that she is now caring for as he moved in with his new girlfriend.  Overall she reports that she is doing well.  She is no longer taking her depression medication.  She reports the only medication that she needs by prescription is the Lotrisone cream for itching when she gets bit by bugs.   Overall reports that she is doing well.  She is now working first shift and she thinks this is going to help her get back in shape and give her more energy.  She is off Friday through Monday.  And is looking forward to not only working in her garden but starting to walk more.  She reports that she is eating much better and not as much junk or sweet food as she was eating.  Her sweet tooth is not as bad as it was.   Health  maintenance she is all caught up she has no outstanding care gaps or immunizations.  Today she denies having any signs or symptoms of infection including cough, shortness of breath, fevers, chills, changes in sinuses and or chest tightness or pressure.  She denies having headaches, vision changes, chest pain.  She denies having changes in bowel or bladder.  She denies having any skin infections or wounds.  She denies having any trouble sleeping.  She denies having any changes in her appetite or eating.  Outside of what was previously stated above and that she is eating a little bit better.  She denies having any changes in her memory.   She reports that she does have some leg swelling especially when she is up on her legs.  But otherwise they are okay.  She does not have any trouble walking around even if they are a little bit swollen.  She does not wear compression hoses at this time.  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  She also reports that she has some eye drainage and or some allergies but overall is not have any difficulties with them.  She does report that she did have a fall about 3 weeks ago in her mom's old room.  Tripped over  something.  But otherwise she did not get hurt and did not feel bad after the fall.   Past Medical, Surgical, Social History, Allergies, and Medications have been Reviewed.  Review Of Systems  Review of Systems  Constitutional: Negative for activity change, appetite change, chills and fever.  HENT: Negative for congestion, ear pain, sinus pressure, sinus pain, sneezing, sore throat, tinnitus and voice change.   Eyes: Negative for photophobia and visual disturbance.  Respiratory: Negative for cough, chest tightness and shortness of breath.   Cardiovascular: Positive for leg swelling. Negative for chest pain and  palpitations.  Gastrointestinal: Negative for constipation and diarrhea.  Endocrine: Negative for polydipsia, polyphagia and polyuria.  Genitourinary: Negative.  Negative for vaginal bleeding, vaginal discharge and vaginal pain.  Musculoskeletal: Positive for arthralgias.  Skin: Negative.   Allergic/Immunologic: Positive for environmental allergies. Negative for food allergies.  Neurological: Negative for dizziness, tremors, weakness and headaches.  Hematological: Negative.   Psychiatric/Behavioral: Negative for confusion and sleep disturbance. The patient is not nervous/anxious.   All other systems reviewed and are negative.   Objective:   PHYSICAL EXAM:  BP 136/80   Pulse 94   Resp 12   Ht 4\' 11"  (1.499 m)   Wt 158 lb (71.7 kg)   SpO2 97%   BMI 31.91 kg/m    Physical Exam Vitals signs and nursing note reviewed.  Constitutional:      General: She is awake.     Appearance: Normal appearance. She is well-developed and well-groomed. She is obese.  HENT:     Head: Normocephalic and atraumatic.     Right Ear: Hearing, tympanic membrane, ear canal and external ear normal.     Left Ear: Hearing, tympanic membrane, ear canal and external ear normal.     Nose: Nose normal. No congestion or rhinorrhea.     Mouth/Throat:     Lips: Pink.     Mouth: Mucous membranes are moist.     Dentition: Has dentures.     Pharynx: Oropharynx is clear. Uvula midline.     Comments: Upper and lower dentures  Eyes:     General: Lids are normal. No scleral icterus.       Right eye: No discharge.        Left eye: No discharge.     Extraocular Movements: Extraocular movements intact.     Conjunctiva/sclera: Conjunctivae normal.     Pupils: Pupils are equal, round, and reactive to light.  Neck:     Musculoskeletal: Full passive range of motion without pain, normal range of motion and neck supple.     Vascular: No carotid bruit.  Cardiovascular:     Rate and Rhythm: Normal rate and regular  rhythm.     Pulses: Normal pulses.          Carotid pulses are 2+ on the right side and 2+ on the left side.      Radial pulses are 2+ on the right side and 2+ on the left side.       Dorsalis pedis pulses are 2+ on the right side and 2+ on the left side.       Posterior tibial pulses are 2+ on the right side and 2+ on the left side.     Heart sounds: Normal heart sounds.  Pulmonary:     Effort: Pulmonary effort is normal.     Breath sounds: Normal breath sounds and air entry.  Chest:     Breasts: Breasts are symmetrical.  Right: Normal. No bleeding, mass, nipple discharge, skin change or tenderness.        Left: Normal. No bleeding, mass, nipple discharge, skin change or tenderness.  Abdominal:     General: Abdomen is flat. Bowel sounds are normal.     Palpations: Abdomen is soft.     Tenderness: There is no abdominal tenderness.     Hernia: No hernia is present.  Musculoskeletal: Normal range of motion.     Right lower leg: No edema.     Left lower leg: No edema.  Lymphadenopathy:     Cervical: No cervical adenopathy.     Upper Body:     Right upper body: No supraclavicular or axillary adenopathy.     Left upper body: No supraclavicular or axillary adenopathy.  Skin:    General: Skin is warm and dry.     Capillary Refill: Capillary refill takes less than 2 seconds.  Neurological:     General: No focal deficit present.     Mental Status: She is alert and oriented to person, place, and time.     Cranial Nerves: Cranial nerves are intact.     Sensory: Sensation is intact.     Motor: Motor function is intact.     Coordination: Coordination is intact.     Gait: Gait is intact.     Deep Tendon Reflexes: Reflexes are normal and symmetric.  Psychiatric:        Attention and Perception: Attention and perception normal.        Mood and Affect: Mood and affect normal.        Speech: Speech normal.        Behavior: Behavior normal. Behavior is cooperative.        Thought  Content: Thought content normal.        Cognition and Memory: Cognition and memory normal.        Judgment: Judgment normal.     Depression Screening  Depression screen [facility] 2/9 06/22/2018 11/15/2017 11/15/2017 09/19/2017 09/07/2017  Decreased Interest 0 0 0 0 0  Down, Depressed, Hopeless 0 0 0 0 1  PHQ - 2 Score 0 0 0 0 1  Altered sleeping - 0 - - 0  Tired, decreased energy - 0 - - 0  Change in appetite - 0 - - 0  Feeling bad or failure about yourself  - 0 - - 0  Trouble concentrating - 0 - - 0  Moving slowly or fidgety/restless - 0 - - 0  Suicidal thoughts - - - - -  PHQ-9 Score - 0 - - 1  Difficult doing work/chores - Not difficult at all - - Not difficult at all  Some recent data might be hidden      Assessment & Plan:   1. Healthcare maintenance Annual exam and healthcare maintenance as documented. Educated and counseled on healthy lifestyle and maintaining a commitment to exercise, heart healthy diet, attaining a healthy weight.  Additionally encouraged the maintenance of her new schedule with her being able to sleep better at night now that she is on first shift.  Encourage use of seatbelt and home safety was also discussed including her AVS.  Changes to health habits are decided on by the patient with goal set him placed in the note.  Immunization and cancer screening needs are specifically addressed and up-to-date in this visit.  Will be getting a set of labs within the next week to assess her fully.  Discussed monthly self breast  exams and yearly mammograms; at least 30 minutes of aerobic activity at least 5 days/week and weight-bearing exercise 2x/week; proper sunscreen use reviewed; healthy diet, including goals of calcium and vitamin D intake reviewed; regular seatbelt use; changing batteries in smoke detectors.  Immunization recommendations discussed.  Colonoscopy recommendations reviewed.  - CBC with Differential/Platelet - COMPLETE METABOLIC PANEL WITH GFR - Hemoglobin  A1c - Lipid panel - TSH - VITAMIN D 25 Hydroxy (Vit-D Deficiency, Fractures)  2. Obesity (BMI 30-39.9) Has lost a little bit of weight since her last visit.  Has been eating a lot better.  She is encouraged to continue to do this.  Heart healthy diet along with low-fat low-cholesterol has been encouraged along with starting an exercise regime that includes 30 minutes on at least 5 days of the week for total of 150 minutes/week.  - COMPLETE METABOLIC PANEL WITH GFR - Hemoglobin A1c - Lipid panel - TSH  3. Itching Controlled, when she gets bit by a bug when she is working outside in the garden she has a really hard time with itching and discomfort and rash.  We will provide her with a refill of her cream.  - clotrimazole-betamethasone (LOTRISONE) cream; Apply 1 application topically 2 (two) times daily.  Dispense: 30 g; Refill: 0  4. Prediabetes Controlled, along with the additional few pounds weight loss and the changes in her diet will check her A1c and see if she is doing better with this.  As she is not currently taking any medications and this is all diet controlled.  - Hemoglobin A1c  5. Vitamin D deficiency Previous level was 25 reports that she is taking her vitamin D and calcium as directed.  As her last DEXA scan did showed some bone thinning we will assess this to make sure that her level has improved.  - VITAMIN D 25 Hydroxy (Vit-D Deficiency, Fractures)     Medicare Attestation I have personally reviewed: The patient's medical and social history Their use of alcohol, tobacco or illicit drugs Their current medications and supplements The patient's functional ability including ADLs,fall risks, home safety risks, cognitive, and hearing and visual impairment Diet and physical activities Evidence for depression or mood disorders  The patient's weight, height, BMI, and visual acuity have been recorded in the chart.  I have made referrals, counseling, and provided  education to the patient based on review of the above and I have provided the patient with a written personalized care plan for preventive services.     Follow-up: PRN or 1 year for CPE  Freddy Finner, NP   06/22/2018

## 2018-07-07 LAB — COMPLETE METABOLIC PANEL WITH GFR
AG Ratio: 1.2 (calc) (ref 1.0–2.5)
ALT: 14 U/L (ref 6–29)
AST: 18 U/L (ref 10–35)
Albumin: 3.7 g/dL (ref 3.6–5.1)
Alkaline phosphatase (APISO): 71 U/L (ref 37–153)
BUN: 13 mg/dL (ref 7–25)
CO2: 28 mmol/L (ref 20–32)
Calcium: 9.2 mg/dL (ref 8.6–10.4)
Chloride: 106 mmol/L (ref 98–110)
Creat: 0.89 mg/dL (ref 0.60–0.93)
GFR, Est African American: 76 mL/min/{1.73_m2} (ref >=60)
GFR, Est Non African American: 65 mL/min/{1.73_m2} (ref >=60)
Globulin: 3.1 g/dL (calc) (ref 1.9–3.7)
Glucose, Bld: 94 mg/dL (ref 65–99)
Potassium: 4.4 mmol/L (ref 3.5–5.3)
Sodium: 141 mmol/L (ref 135–146)
Total Bilirubin: 0.4 mg/dL (ref 0.2–1.2)
Total Protein: 6.8 g/dL (ref 6.1–8.1)

## 2018-07-07 LAB — CBC WITH DIFFERENTIAL/PLATELET
Absolute Monocytes: 301 cells/uL (ref 200–950)
Basophils Absolute: 31 cells/uL (ref 0–200)
Basophils Relative: 1 %
Eosinophils Absolute: 192 cells/uL (ref 15–500)
Eosinophils Relative: 6.2 %
HCT: 38.8 % (ref 35.0–45.0)
Hemoglobin: 12.8 g/dL (ref 11.7–15.5)
Lymphs Abs: 1438 cells/uL (ref 850–3900)
MCH: 26.7 pg — ABNORMAL LOW (ref 27.0–33.0)
MCHC: 33 g/dL (ref 32.0–36.0)
MCV: 81 fL (ref 80.0–100.0)
MPV: 10.1 fL (ref 7.5–12.5)
Monocytes Relative: 9.7 %
Neutro Abs: 1138 cells/uL — ABNORMAL LOW (ref 1500–7800)
Neutrophils Relative %: 36.7 %
Platelets: 205 10*3/uL (ref 140–400)
RBC: 4.79 10*6/uL (ref 3.80–5.10)
RDW: 13.8 % (ref 11.0–15.0)
Total Lymphocyte: 46.4 %
WBC: 3.1 10*3/uL — ABNORMAL LOW (ref 3.8–10.8)

## 2018-07-07 LAB — LIPID PANEL
Cholesterol: 181 mg/dL (ref <200)
HDL: 53 mg/dL (ref >=50)
LDL Cholesterol (Calc): 103 mg/dL (calc) — ABNORMAL HIGH
Non-HDL Cholesterol (Calc): 128 mg/dL (calc) (ref <130)
Total CHOL/HDL Ratio: 3.4 (calc) (ref <5.0)
Triglycerides: 149 mg/dL (ref <150)

## 2018-07-07 LAB — HEMOGLOBIN A1C
Hgb A1c MFr Bld: 5.8 % of total Hgb — ABNORMAL HIGH (ref <5.7)
Mean Plasma Glucose: 120 (calc)
eAG (mmol/L): 6.6 (calc)

## 2018-07-07 LAB — TSH: TSH: 3.02 mIU/L (ref 0.40–4.50)

## 2018-07-07 LAB — VITAMIN D 25 HYDROXY (VIT D DEFICIENCY, FRACTURES): Vit D, 25-Hydroxy: 24 ng/mL — ABNORMAL LOW (ref 30–100)

## 2018-07-10 NOTE — Progress Notes (Signed)
Liver and Kidney are good. A1c is stable at 5.8-still prediabetic range. Watch sugar and carb intake. Cholesterol was has increased some, but still normal, but slight elevation in LDL (bad cholesterol).  Watch intake of fried, fatty, oily, foods. Avoid sweets, breads, and premade foods. Thyroid is good. Vitamin D is the same as a year ago. I think an extra dose would be benefit. 1,00 units daily. Can get this OTC.  Blood levels are stable. Maintain MVT and well balanced diet.

## 2018-10-09 ENCOUNTER — Ambulatory Visit: Payer: BLUE CROSS/BLUE SHIELD | Admitting: Family Medicine

## 2018-10-15 ENCOUNTER — Ambulatory Visit (INDEPENDENT_AMBULATORY_CARE_PROVIDER_SITE_OTHER): Payer: BC Managed Care – PPO | Admitting: Family Medicine

## 2018-10-15 ENCOUNTER — Other Ambulatory Visit (HOSPITAL_COMMUNITY)
Admission: RE | Admit: 2018-10-15 | Discharge: 2018-10-15 | Disposition: A | Discharge status: Home / Self Care | Payer: BC Managed Care – PPO | Source: Ambulatory Visit | Attending: Family Medicine | Admitting: Family Medicine

## 2018-10-15 ENCOUNTER — Encounter: Payer: Self-pay | Admitting: Family Medicine

## 2018-10-15 ENCOUNTER — Other Ambulatory Visit: Payer: Self-pay

## 2018-10-15 VITALS — BP 160/82 | HR 59 | Temp 97.4°F | Resp 15 | Ht 59.0 in | Wt 148.0 lb

## 2018-10-15 DIAGNOSIS — E669 Obesity, unspecified: Secondary | ICD-10-CM | POA: Insufficient documentation

## 2018-10-15 DIAGNOSIS — N760 Acute vaginitis: Secondary | ICD-10-CM | POA: Insufficient documentation

## 2018-10-15 DIAGNOSIS — E663 Overweight: Secondary | ICD-10-CM

## 2018-10-15 DIAGNOSIS — Z1211 Encounter for screening for malignant neoplasm of colon: Secondary | ICD-10-CM | POA: Diagnosis not present

## 2018-10-15 DIAGNOSIS — E66811 Obesity, class 1: Secondary | ICD-10-CM | POA: Insufficient documentation

## 2018-10-15 DIAGNOSIS — I10 Essential (primary) hypertension: Secondary | ICD-10-CM

## 2018-10-15 DIAGNOSIS — R928 Other abnormal and inconclusive findings on diagnostic imaging of breast: Secondary | ICD-10-CM

## 2018-10-15 DIAGNOSIS — N898 Other specified noninflammatory disorders of vagina: Secondary | ICD-10-CM | POA: Diagnosis not present

## 2018-10-15 DIAGNOSIS — Z23 Encounter for immunization: Secondary | ICD-10-CM

## 2018-10-15 MED ORDER — AMLODIPINE BESYLATE 5 MG PO TABS: 5.0000 mg | ORAL_TABLET | Freq: Every day | ORAL | 3 refills | Status: DC

## 2018-10-15 NOTE — Assessment & Plan Note (Signed)
New start amlodipine 5 mg one daily DASH diet and commitment to daily physical activity for a minimum of 30 minutes discussed and encouraged, as a part of hypertension management. The importance of attaining a healthy weight is also discussed.  BP/Weight 10/15/2018 06/22/2018 11/15/2017 09/19/2017 08/10/2017 09/10/5724 2/0/3559  Systolic BP 741 638 453 646 803 212 248  Diastolic BP 82 80 70 74 76 80 70  Wt. (Lbs) 148 158 162 160 159.12 157 159  BMI 29.89 31.91 32.72 32.32 32.14 31.71 32.11   F/u in 8 weeks

## 2018-10-15 NOTE — Addendum Note (Signed)
Addended by: Eual Fines on: 10/15/2018 02:28 PM   Modules accepted: Orders

## 2018-10-15 NOTE — Addendum Note (Signed)
Addended by: Eual Fines on: 10/15/2018 01:41 PM   Modules accepted: Orders

## 2018-10-15 NOTE — Patient Instructions (Signed)
F/u in office with mD for bP re eval in 6  To 8 weeks, call if you need me sooner  Flu vaccine today  Please set pt up for cologuard testing  Please schedule  mamogram at checkout  New for blood pressure is amlodipine 5 mg one daily  Thanks for choosing  [facility], we consider it a privelige to serve you.

## 2018-10-15 NOTE — Assessment & Plan Note (Signed)
After obtaining informed consent, the vaccine is  administered , with no adverse effect noted at the time of administration.  

## 2018-10-15 NOTE — Progress Notes (Signed)
   Amber Fernandez     MRN: [id number]      DOB: [birth year]   HPI Amber Fernandez is here for follow up and re-evaluation of chronic medical conditions, medication management and review of any available recent lab and radiology data.  Preventive health is updated, specifically  Cancer screening and Immunization.   Questions or concerns regarding consultations or procedures which the PT has had in the interim are  addressed. The PT denies any adverse reactions to current medications since the last visit.  1 week h/o increased white vaginal discharge, is sexually active from time to time, denies dysuria, frequency or flank pain  ROS Denies recent fever or chills. Denies sinus pressure, nasal congestion, ear pain or sore throat. Denies chest congestion, productive cough or wheezing. Denies chest pains, palpitations and leg swelling Denies abdominal pain, nausea, vomiting,diarrhea or constipation.   Denies dysuria, frequency, hesitancy or incontinence. Denies joint pain, swelling and limitation in mobility. Denies headaches, seizures, numbness, or tingling. Denies depression, anxiety or insomnia. Denies skin break down or rash.   PE  BP (!) 160/82   Pulse (!) 59   Temp (!) 97.4 F (36.3 C) (Temporal)   Resp 15   Ht 4\' 11"  (1.499 m)   Wt 148 lb (67.1 kg)   SpO2 99%   BMI 29.89 kg/m    Patient alert and oriented and in no cardiopulmonary distress.  HEENT: No facial asymmetry, EOMI,   oropharynx pink and moist.  Neck supple no JVD, no mass.  Chest: Clear to auscultation bilaterally.  CVS: S1, S2 no murmurs, no S3.Regular rate.  ABD: Soft non tender. No renal angle tenderness Ext: No edema  MS: Adequate ROM spine, shoulders, hips and knees.  Skin: Intact, no ulcerations or rash noted.  Psych: Good eye contact, normal affect. Memory intact not anxious or depressed appearing.  CNS: CN 2-12 intact, power,  normal throughout.no focal deficits noted.   Assessment & Plan   Essential hypertension New start amlodipine 5 mg one daily DASH diet and commitment to daily physical activity for a minimum of 30 minutes discussed and encouraged, as a part of hypertension management. The importance of attaining a healthy weight is also discussed.  BP/Weight 10/15/2018 06/22/2018 11/15/2017 09/19/2017 08/10/2017 09/12/7515 0/0/1749  Systolic BP 449 675 916 384 665 993 570  Diastolic BP 82 80 70 74 76 80 70  Wt. (Lbs) 148 158 162 160 159.12 157 159  BMI 29.89 31.91 32.72 32.32 32.14 31.71 32.11   F/u in 8 weeks    Vaginal discharge Symptomatic MILDLY and sexually active , send for wet prep  Overweight (BMI 25.0-29.9)  Patient re-educated about  the importance of commitment to a  minimum of 150 minutes of exercise per week as able.  The importance of healthy food choices with portion control discussed, as well as eating regularly and within a 12 hour window most days. The need to choose "clean , green" food 50 to 75% of the time is discussed, as well as to make water the primary drink and set a goal of 64 ounces water daily.    Weight /BMI 10/15/2018 06/22/2018 11/15/2017  WEIGHT 148 lb 158 lb 162 lb  HEIGHT 4\' 11"  4\' 11"  4\' 11"   BMI 29.89 kg/m2 31.91 kg/m2 32.72 kg/m2      Need for immunization against influenza After obtaining informed consent, the vaccine is  administered , with no adverse effect noted at the time of administration.

## 2018-10-15 NOTE — Assessment & Plan Note (Signed)
Symptomatic MILDLY and sexually active , send for wet prep

## 2018-10-15 NOTE — Assessment & Plan Note (Signed)
  Patient re-educated about  the importance of commitment to a  minimum of 150 minutes of exercise per week as able.  The importance of healthy food choices with portion control discussed, as well as eating regularly and within a 12 hour window most days. The need to choose "clean , green" food 50 to 75% of the time is discussed, as well as to make water the primary drink and set a goal of 64 ounces water daily.    Weight /BMI 10/15/2018 06/22/2018 11/15/2017  WEIGHT 148 lb 158 lb 162 lb  HEIGHT 4\' 11"  4\' 11"  4\' 11"   BMI 29.89 kg/m2 31.91 kg/m2 32.72 kg/m2

## 2018-10-17 LAB — URINE CYTOLOGY ANCILLARY ONLY
Chlamydia: NEGATIVE
Neisseria Gonorrhea: NEGATIVE
Trichomonas: NEGATIVE

## 2018-10-19 LAB — URINE CYTOLOGY ANCILLARY ONLY
Bacterial vaginitis: NEGATIVE
Candida vaginitis: NEGATIVE

## 2018-10-28 DIAGNOSIS — Z1211 Encounter for screening for malignant neoplasm of colon: Secondary | ICD-10-CM | POA: Diagnosis not present

## 2018-10-28 LAB — COLOGUARD: Cologuard: NEGATIVE

## 2018-11-26 ENCOUNTER — Ambulatory Visit (INDEPENDENT_AMBULATORY_CARE_PROVIDER_SITE_OTHER): Payer: BC Managed Care – PPO | Admitting: Family Medicine

## 2018-11-26 ENCOUNTER — Encounter: Payer: Self-pay | Admitting: Family Medicine

## 2018-11-26 ENCOUNTER — Other Ambulatory Visit: Payer: Self-pay

## 2018-11-26 ENCOUNTER — Other Ambulatory Visit (HOSPITAL_COMMUNITY): Payer: Self-pay | Admitting: Family Medicine

## 2018-11-26 VITALS — BP 120/76 | HR 77 | Temp 98.1°F | Resp 15 | Ht 59.0 in | Wt 149.1 lb

## 2018-11-26 DIAGNOSIS — R7303 Prediabetes: Secondary | ICD-10-CM

## 2018-11-26 DIAGNOSIS — I10 Essential (primary) hypertension: Secondary | ICD-10-CM

## 2018-11-26 DIAGNOSIS — E559 Vitamin D deficiency, unspecified: Secondary | ICD-10-CM | POA: Diagnosis not present

## 2018-11-26 DIAGNOSIS — E66811 Obesity, class 1: Secondary | ICD-10-CM

## 2018-11-26 DIAGNOSIS — Z780 Asymptomatic menopausal state: Secondary | ICD-10-CM

## 2018-11-26 DIAGNOSIS — R928 Other abnormal and inconclusive findings on diagnostic imaging of breast: Secondary | ICD-10-CM

## 2018-11-26 DIAGNOSIS — Z13220 Encounter for screening for lipoid disorders: Secondary | ICD-10-CM | POA: Diagnosis not present

## 2018-11-26 DIAGNOSIS — E669 Obesity, unspecified: Secondary | ICD-10-CM | POA: Diagnosis not present

## 2018-11-26 NOTE — Assessment & Plan Note (Signed)
Patient educated about the importance of limiting  Carbohydrate intake , the need to commit to daily physical activity for a minimum of 30 minutes , and to commit weight loss. The fact that changes in all these areas will reduce or eliminate all together the development of diabetes is stressed.   Diabetic Labs Latest Ref Rng & Units 07/06/2018 04/20/2017 03/31/2016 04/02/2015 05/14/2014  HbA1c <5.7 % of total Hgb 5.8(H) 5.9(H) 5.6 5.9(H) 6.0(H)  Chol <200 mg/dL 181 176 171 190 185  HDL > OR = 50 mg/dL 53 62 66 79 75  Calc LDL mg/dL (calc) 103(H) 98 90 96 95  Triglycerides <150 mg/dL 149 75 77 73 75  Creatinine 0.60 - 0.93 mg/dL 0.89 0.82 0.88 0.89 0.80   BP/Weight 11/26/2018 10/15/2018 06/22/2018 11/15/2017 09/19/2017 08/10/2017 07/31/1599  Systolic BP 093 235 573 220 254 270 623  Diastolic BP 76 82 80 70 74 76 80  Wt. (Lbs) 149.12 148 158 162 160 159.12 157  BMI 30.12 29.89 31.91 32.72 32.32 32.14 31.71   No flowsheet data found.  Updated lab needed at/ before next visit.

## 2018-11-26 NOTE — Patient Instructions (Signed)
Physical exam in office with MD mid April, call if you need me sooner  Please schedule bone density test between [place]  And January 2, pt is on vacation  Excellent BP , continue medication Congrats on weight loss, keep it up  Fasting CBC,lipid, cmp and eGFr, hBA1C, TSH and vit D first week in April  Thanks for choosing [facility], we consider it a privelige to serve you.

## 2018-11-26 NOTE — Assessment & Plan Note (Signed)
  Patient re-educated about  the importance of commitment to a  minimum of 150 minutes of exercise per week as able.  The importance of healthy food choices with portion control discussed, as well as eating regularly and within a 12 hour window most days. The need to choose "clean , green" food 50 to 75% of the time is discussed, as well as to make water the primary drink and set a goal of 64 ounces water daily.    Weight /BMI 11/26/2018 10/15/2018 06/22/2018  WEIGHT 149 lb 1.9 oz 148 lb 158 lb  HEIGHT 4\' 11"  4\' 11"  4\' 11"   BMI 30.12 kg/m2 29.89 kg/m2 31.91 kg/m2

## 2018-11-26 NOTE — Assessment & Plan Note (Signed)
Controlled, no change in medication DASH diet and commitment to daily physical activity for a minimum of 30 minutes discussed and encouraged, as a part of hypertension management. The importance of attaining a healthy weight is also discussed.  BP/Weight 11/26/2018 10/15/2018 06/22/2018 11/15/2017 09/19/2017 08/10/2017 1/60/7371  Systolic BP 062 694 854 627 035 009 381  Diastolic BP 76 82 80 70 74 76 80  Wt. (Lbs) 149.12 148 158 162 160 159.12 157  BMI 30.12 29.89 31.91 32.72 32.32 32.14 31.71

## 2018-11-26 NOTE — Progress Notes (Signed)
Amber Fernandez     MRN: [id number]      DOB: Jan 10, 1946   HPI Amber Fernandez is here for follow up and re-evaluation of chronic medical conditions, in particular hypertension, medication management and review of any available recent lab and radiology data.  Preventive health is updated, specifically  Cancer screening and Immunization.   Questions or concerns regarding consultations or procedures which the PT has had in the interim are  addressed. The PT denies any adverse reactions to current medications since the last visit.  There are no new concerns.  There are no specific complaints   ROS Denies recent fever or chills. Denies sinus pressure, nasal congestion, ear pain or sore throat. Denies chest congestion, productive cough or wheezing. Denies chest pains, palpitations and leg swelling Denies abdominal pain, nausea, vomiting,diarrhea or constipation.   Denies dysuria, frequency, hesitancy or incontinence. Denies joint pain, swelling and limitation in mobility. Denies headaches, seizures, numbness, or tingling. Denies depression, anxiety or insomnia. Denies skin break down or rash.   PE  BP 120/76   Pulse 77   Temp 98.1 F (36.7 C) (Oral)   Resp 15   Ht 4\' 11"  (1.499 m)   Wt 149 lb 1.9 oz (67.6 kg)   SpO2 97%   BMI 30.12 kg/m   Patient alert and oriented and in no cardiopulmonary distress.  HEENT: No facial asymmetry, EOMI,     Neck supple .  Chest: Clear to auscultation bilaterally.  CVS: S1, S2 no murmurs, no S3.Regular rate.  ABD: Soft non tender.   Ext: No edema  MS: Adequate ROM spine, shoulders, hips and knees.  Skin: Intact, no ulcerations or rash noted.  Psych: Good eye contact, normal affect. Memory intact not anxious or depressed appearing.  CNS: CN 2-12 intact, power,  normal throughout.no focal deficits noted.   Assessment & Plan  Essential hypertension Controlled, no change in medication DASH diet and commitment to daily physical activity  for a minimum of 30 minutes discussed and encouraged, as a part of hypertension management. The importance of attaining a healthy weight is also discussed.  BP/Weight 11/26/2018 10/15/2018 06/22/2018 11/15/2017 09/19/2017 08/10/2017 6/96/7893  Systolic BP 810 175 102 585 277 824 235  Diastolic BP 76 82 80 70 74 76 80  Wt. (Lbs) 149.12 148 158 162 160 159.12 157  BMI 30.12 29.89 31.91 32.72 32.32 32.14 31.71       Prediabetes Patient educated about the importance of limiting  Carbohydrate intake , the need to commit to daily physical activity for a minimum of 30 minutes , and to commit weight loss. The fact that changes in all these areas will reduce or eliminate all together the development of diabetes is stressed.   Diabetic Labs Latest Ref Rng & Units 07/06/2018 04/20/2017 03/31/2016 04/02/2015 05/14/2014  HbA1c <5.7 % of total Hgb 5.8(H) 5.9(H) 5.6 5.9(H) 6.0(H)  Chol <200 mg/dL 181 176 171 190 185  HDL > OR = 50 mg/dL 53 62 66 79 75  Calc LDL mg/dL (calc) 103(H) 98 90 96 95  Triglycerides <150 mg/dL 149 75 77 73 75  Creatinine 0.60 - 0.93 mg/dL 0.89 0.82 0.88 0.89 0.80   BP/Weight 11/26/2018 10/15/2018 06/22/2018 11/15/2017 09/19/2017 08/10/2017 3/61/4431  Systolic BP 540 086 761 950 932 671 245  Diastolic BP 76 82 80 70 74 76 80  Wt. (Lbs) 149.12 148 158 162 160 159.12 157  BMI 30.12 29.89 31.91 32.72 32.32 32.14 31.71   No flowsheet data found.  Updated lab needed at/ before next visit.   Obesity (BMI 30.0-34.9)  Patient re-educated about  the importance of commitment to a  minimum of 150 minutes of exercise per week as able.  The importance of healthy food choices with portion control discussed, as well as eating regularly and within a 12 hour window most days. The need to choose "clean , green" food 50 to 75% of the time is discussed, as well as to make water the primary drink and set a goal of 64 ounces water daily.    Weight /BMI 11/26/2018 10/15/2018 06/22/2018  WEIGHT 149 lb  1.9 oz 148 lb 158 lb  HEIGHT 4\' 11"  4\' 11"  4\' 11"   BMI 30.12 kg/m2 29.89 kg/m2 31.91 kg/m2

## 2018-12-04 ENCOUNTER — Ambulatory Visit (HOSPITAL_COMMUNITY): Admission: RE | Admit: 2018-12-04 | Payer: BC Managed Care – PPO | Source: Ambulatory Visit

## 2018-12-04 ENCOUNTER — Ambulatory Visit (HOSPITAL_COMMUNITY)
Admission: RE | Admit: 2018-12-04 | Discharge: 2018-12-04 | Disposition: A | Discharge status: Home / Self Care | Payer: BC Managed Care – PPO | Source: Ambulatory Visit | Attending: Family Medicine | Admitting: Family Medicine

## 2018-12-04 ENCOUNTER — Other Ambulatory Visit: Payer: Self-pay

## 2018-12-04 DIAGNOSIS — R928 Other abnormal and inconclusive findings on diagnostic imaging of breast: Secondary | ICD-10-CM | POA: Diagnosis not present

## 2019-01-18 ENCOUNTER — Other Ambulatory Visit: Payer: Self-pay

## 2019-01-18 ENCOUNTER — Ambulatory Visit (HOSPITAL_COMMUNITY)
Admission: RE | Admit: 2019-01-18 | Discharge: 2019-01-18 | Disposition: A | Discharge status: Home / Self Care | Payer: BC Managed Care – PPO | Source: Ambulatory Visit | Attending: Family Medicine | Admitting: Family Medicine

## 2019-01-18 DIAGNOSIS — Z780 Asymptomatic menopausal state: Secondary | ICD-10-CM

## 2019-02-09 ENCOUNTER — Other Ambulatory Visit: Payer: Self-pay | Admitting: Family Medicine

## 2019-04-07 IMAGING — MG DIGITAL DIAGNOSTIC BILATERAL MAMMOGRAM WITH TOMO AND CAD
6 of 9 series · 6 of 25 positions shown · non-contrast
Comparison: Previous exam(s).

CLINICAL DATA: One year follow-up for probably benign right breast
mass.

EXAM:
DIGITAL DIAGNOSTIC BILATERAL MAMMOGRAM WITH CAD AND TOMO
LEFT AXILLA ULTRASOUND

[L MLO (1 of 2)]
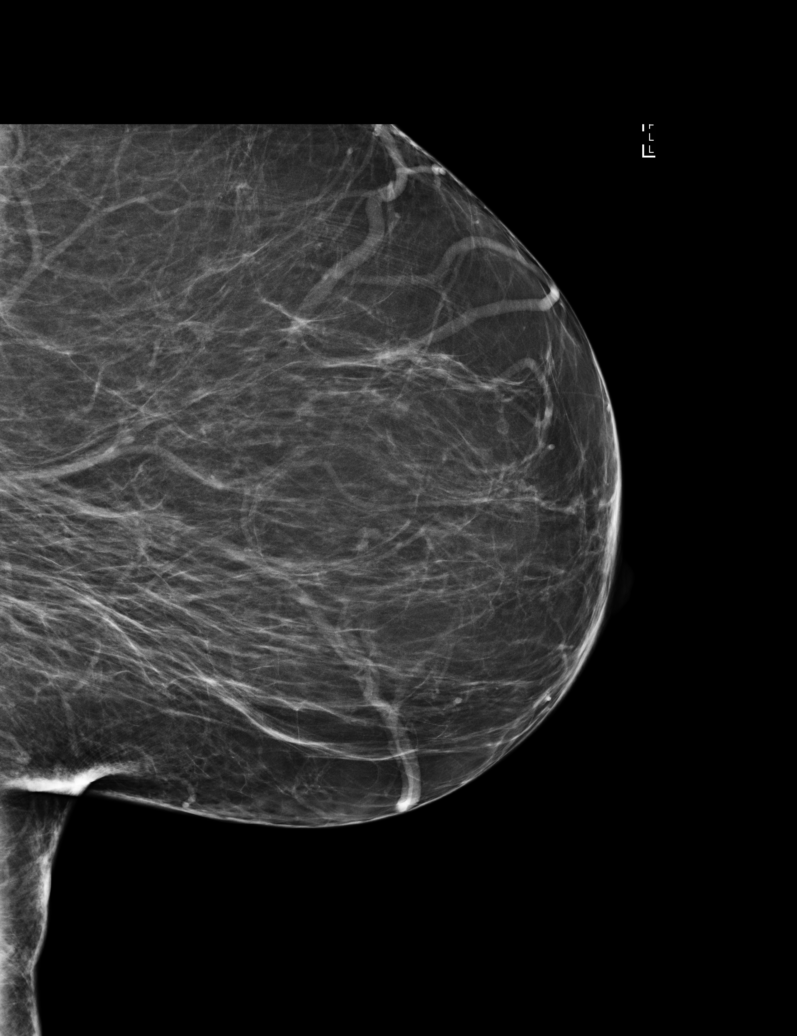

[R CC]
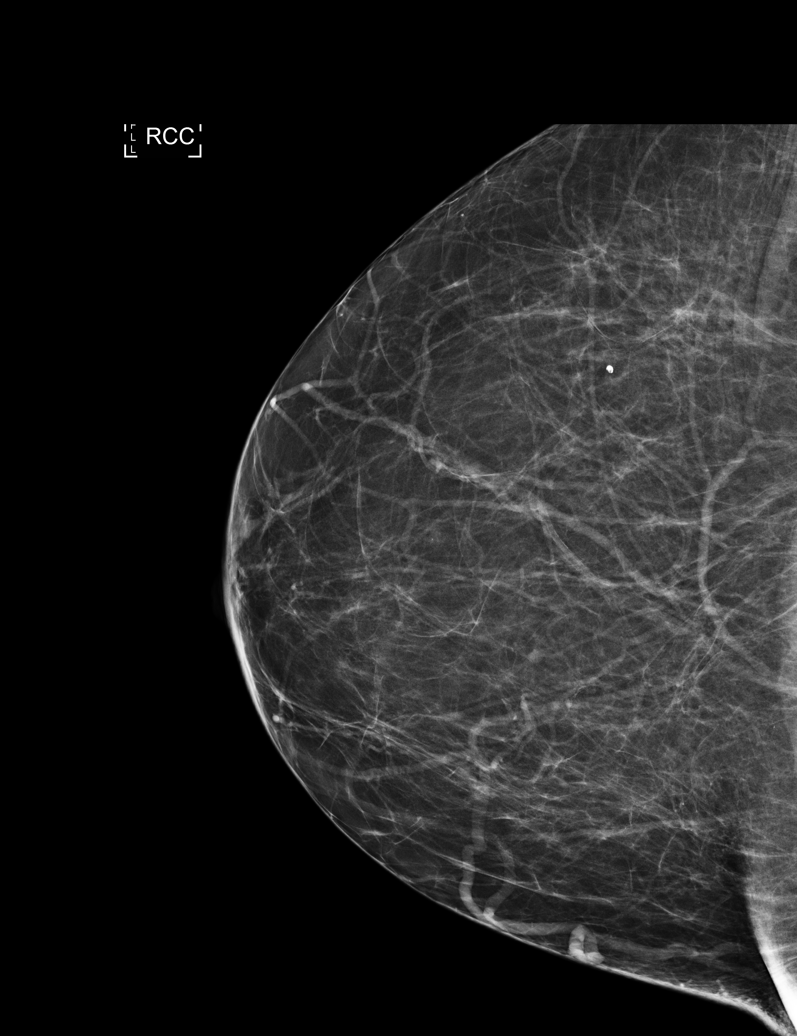

[L MLO (2 of 2)]
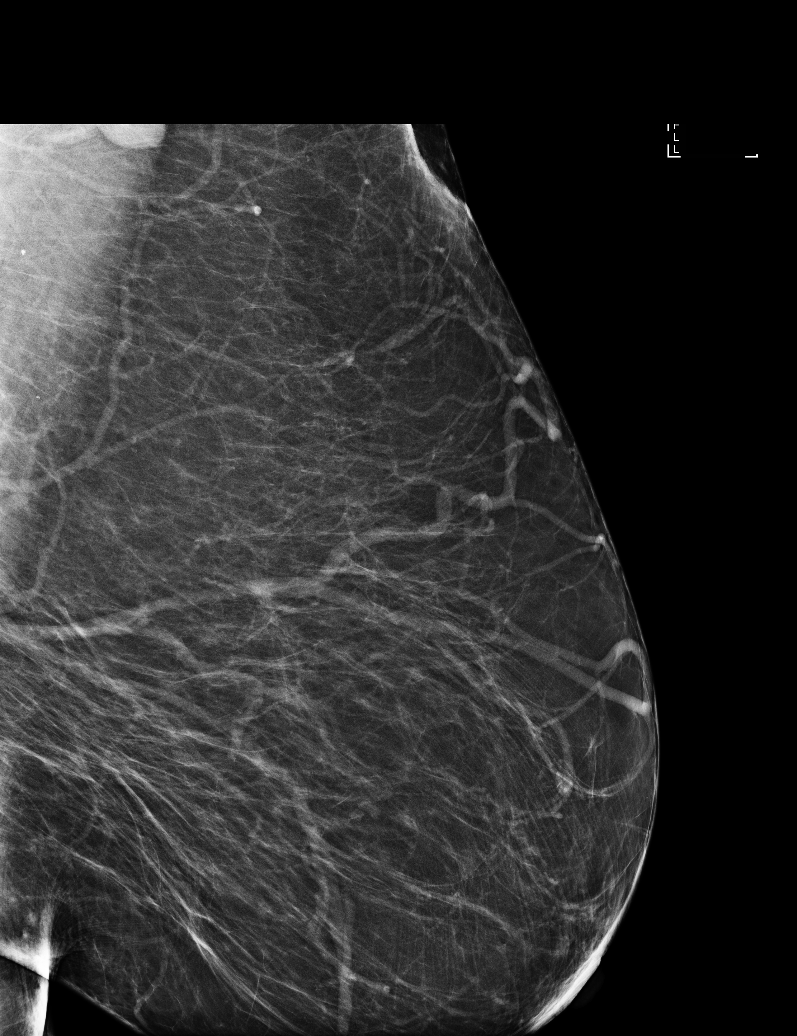

[R MLO]
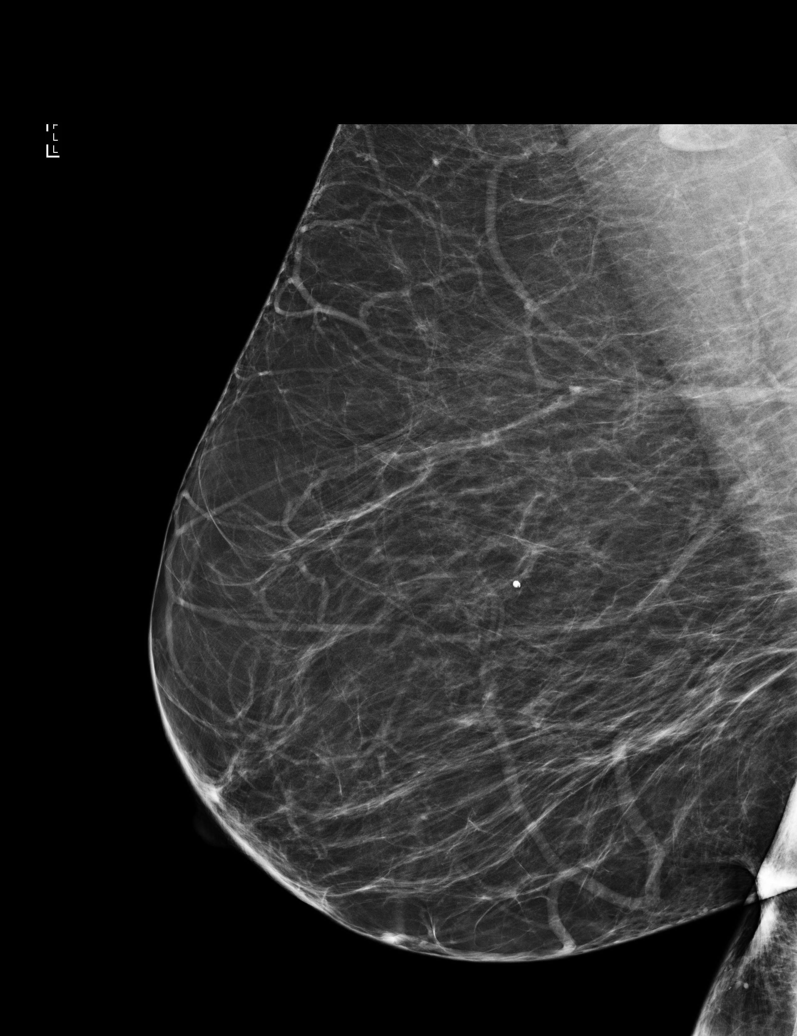

[L CC]
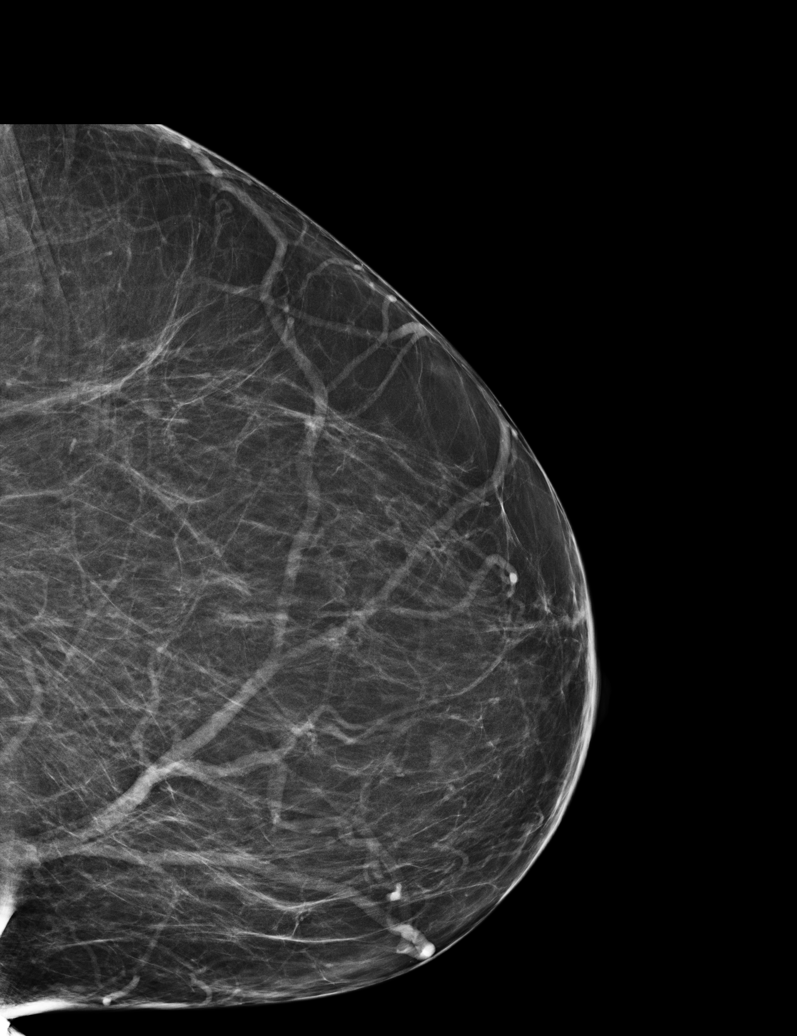

[R CC tomo · tomo slice 36/71.0]
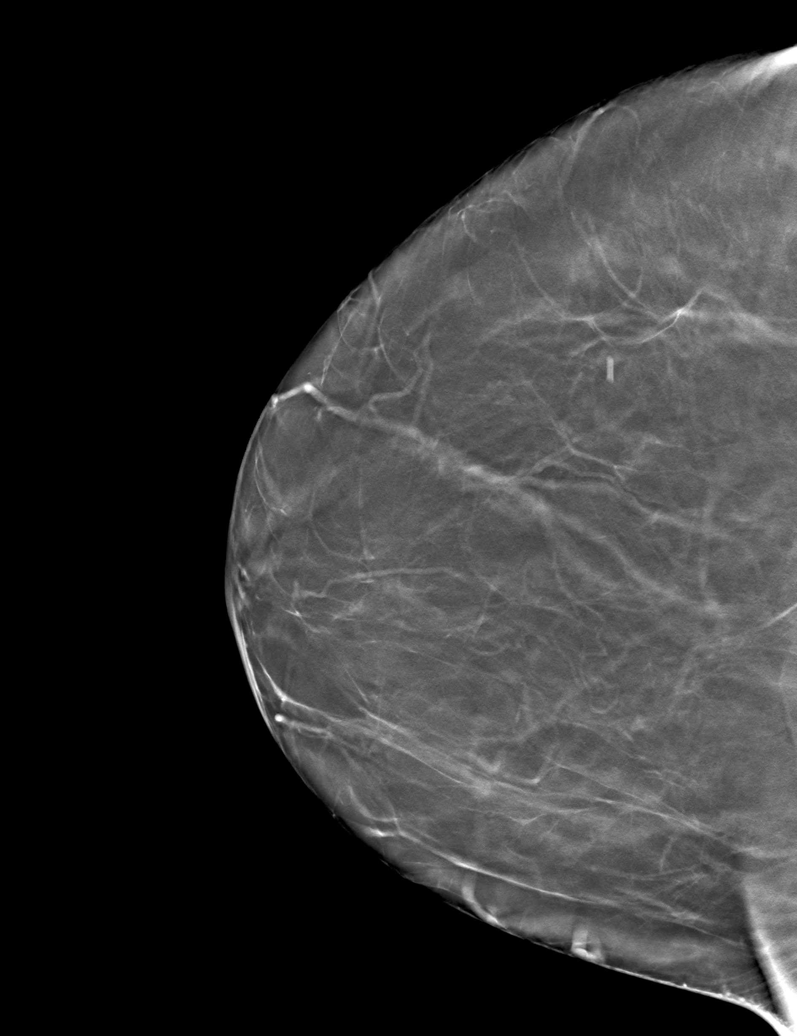

[6 of 25 positions shown; findings below may reference images not displayed]

ACR Breast Density Category b: There are scattered areas of
fibroglandular density.
FINDINGS: No suspicious masses or calcifications seen in the right breast. The
benign appearing oval nodule in the inner right breast is stable to
slightly smaller when compared to prior mammogram from 07/06/2016.
There is a lymph node with a thickened cortex in the left axilla,
increased in size when compared to the prior exam from 7067.

Mammographic images were processed with CAD.

Targeted ultrasound of the left axilla was performed demonstrating
several lymph nodes in the left axilla with borderline cortical
thickening, however all of which contain fatty/echogenic hila. These
are felt to be reactive.
IMPRESSION: Probably benign likely reactive lymph node in the left axilla.

RECOMMENDATION:
Diagnostic mammography of the left breast with possible ultrasound
of the left axilla in 3 months to demonstrate normalization of
probably benign/probable reactive lymph node in the left axilla.

I have discussed the findings and recommendations with the patient.
Results were also provided in writing at the conclusion of the
visit. If applicable, a reminder letter will be sent to the patient
regarding the next appointment.

BI-RADS CATEGORY  3: Probably benign.

## 2019-04-16 ENCOUNTER — Ambulatory Visit (INDEPENDENT_AMBULATORY_CARE_PROVIDER_SITE_OTHER): Payer: BC Managed Care – PPO | Admitting: Family Medicine

## 2019-04-16 ENCOUNTER — Encounter: Payer: Self-pay | Admitting: Family Medicine

## 2019-04-16 ENCOUNTER — Ambulatory Visit: Payer: BC Managed Care – PPO | Admitting: Family Medicine

## 2019-04-16 ENCOUNTER — Other Ambulatory Visit: Payer: Self-pay

## 2019-04-16 VITALS — BP 144/71 | HR 74 | Temp 97.6°F | Ht 59.0 in | Wt 151.2 lb

## 2019-04-16 DIAGNOSIS — R42 Dizziness and giddiness: Secondary | ICD-10-CM

## 2019-04-16 DIAGNOSIS — I10 Essential (primary) hypertension: Secondary | ICD-10-CM | POA: Diagnosis not present

## 2019-04-16 DIAGNOSIS — H538 Other visual disturbances: Secondary | ICD-10-CM | POA: Diagnosis not present

## 2019-04-16 HISTORY — DX: Dizziness and giddiness: R42
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
## 2019-04-16 NOTE — Patient Instructions (Addendum)
Stat eye exam on Wed for blurred vision with watery eye  labwork today-[facility]

## 2019-04-17 ENCOUNTER — Telehealth: Payer: Self-pay | Admitting: Emergency Medicine

## 2019-04-17 DIAGNOSIS — I951 Orthostatic hypotension: Secondary | ICD-10-CM | POA: Diagnosis not present

## 2019-04-17 DIAGNOSIS — H25813 Combined forms of age-related cataract, bilateral: Secondary | ICD-10-CM | POA: Diagnosis not present

## 2019-04-17 LAB — COMPLETE METABOLIC PANEL WITH GFR
AG Ratio: 1.2 (calc) (ref 1.0–2.5)
ALT: 18 U/L (ref 6–29)
AST: 22 U/L (ref 10–35)
Albumin: 4.2 g/dL (ref 3.6–5.1)
Alkaline phosphatase (APISO): 74 U/L (ref 37–153)
BUN: 14 mg/dL (ref 7–25)
CO2: 28 mmol/L (ref 20–32)
Calcium: 10.1 mg/dL (ref 8.6–10.4)
Chloride: 105 mmol/L (ref 98–110)
Creat: 0.8 mg/dL (ref 0.60–0.93)
GFR, Est African American: 86 mL/min/{1.73_m2} (ref >=60)
GFR, Est Non African American: 74 mL/min/{1.73_m2} (ref >=60)
Globulin: 3.4 g/dL (calc) (ref 1.9–3.7)
Glucose, Bld: 87 mg/dL (ref 65–99)
Potassium: 4.3 mmol/L (ref 3.5–5.3)
Sodium: 142 mmol/L (ref 135–146)
Total Bilirubin: 0.5 mg/dL (ref 0.2–1.2)
Total Protein: 7.6 g/dL (ref 6.1–8.1)

## 2019-04-17 LAB — CBC WITH DIFFERENTIAL/PLATELET
Absolute Monocytes: 349 cells/uL (ref 200–950)
Basophils Absolute: 42 cells/uL (ref 0–200)
Basophils Relative: 1 %
Eosinophils Absolute: 151 cells/uL (ref 15–500)
Eosinophils Relative: 3.6 %
HCT: 44 % (ref 35.0–45.0)
Hemoglobin: 14.3 g/dL (ref 11.7–15.5)
Lymphs Abs: 1588 cells/uL (ref 850–3900)
MCH: 26.6 pg — ABNORMAL LOW (ref 27.0–33.0)
MCHC: 32.5 g/dL (ref 32.0–36.0)
MCV: 81.9 fL (ref 80.0–100.0)
MPV: 9.6 fL (ref 7.5–12.5)
Monocytes Relative: 8.3 %
Neutro Abs: 2071 cells/uL (ref 1500–7800)
Neutrophils Relative %: 49.3 %
Platelets: 234 10*3/uL (ref 140–400)
RBC: 5.37 10*6/uL — ABNORMAL HIGH (ref 3.80–5.10)
RDW: 13.3 % (ref 11.0–15.0)
Total Lymphocyte: 37.8 %
WBC: 4.2 10*3/uL (ref 3.8–10.8)

## 2019-04-17 LAB — TSH: TSH: 1.81 mIU/L (ref 0.40–4.50)

## 2019-04-17 NOTE — Telephone Encounter (Signed)
Left a msg on machine that lab work was normal

## 2019-04-17 NOTE — Telephone Encounter (Signed)
-----   Message from Wandra Feinstein, MD sent at 04/17/2019  8:15 AM EDT ----- Your labwork is normal

## 2019-04-29 ENCOUNTER — Telehealth: Payer: Self-pay | Admitting: Family Medicine

## 2019-04-29 NOTE — Telephone Encounter (Signed)
Patient is calling and states she seen the eye doctor at [facility] last week and the doctor was going to fax Dr. Judee Clara his notes. She states he was saying that her blood pressure is what is causing blurred vision with dizziness. Please advise.

## 2019-04-29 NOTE — Telephone Encounter (Signed)
FYI and please advise.

## 2019-04-29 NOTE — Telephone Encounter (Signed)
Check to see what pts BP at home is running?  I will be happy to see pt for follow up. It looks like a wellness scheduled but Dr. Lodema Hong will not be checking blood pressure so pt can schedule to be seen . Goal for blood pressure 130/80

## 2019-04-30 NOTE — Telephone Encounter (Signed)
Detail msg below was left on machine and to give [place] a cal to f/u

## 2019-05-06 ENCOUNTER — Telehealth: Payer: Self-pay

## 2019-05-06 NOTE — Telephone Encounter (Signed)
Pt went to the lab today, and they made her pay 168.00 as there was not a DX code on the TSH.  Please update the DX code for the pt so she can get reimburst.

## 2019-05-07 ENCOUNTER — Other Ambulatory Visit: Payer: Self-pay | Admitting: Family Medicine

## 2019-05-07 LAB — LIPID PANEL
Cholesterol: 194 mg/dL (ref <200)
HDL: 68 mg/dL (ref >=50)
LDL Cholesterol (Calc): 107 mg/dL (calc) — ABNORMAL HIGH
Non-HDL Cholesterol (Calc): 126 mg/dL (calc) (ref <130)
Total CHOL/HDL Ratio: 2.9 (calc) (ref <5.0)
Triglycerides: 98 mg/dL (ref <150)

## 2019-05-07 LAB — COMPLETE METABOLIC PANEL WITH GFR
AG Ratio: 1.3 (calc) (ref 1.0–2.5)
ALT: 15 U/L (ref 6–29)
AST: 19 U/L (ref 10–35)
Albumin: 4 g/dL (ref 3.6–5.1)
Alkaline phosphatase (APISO): 69 U/L (ref 37–153)
BUN/Creatinine Ratio: 12 (calc) (ref 6–22)
BUN: 12 mg/dL (ref 7–25)
CO2: 25 mmol/L (ref 20–32)
Calcium: 9.2 mg/dL (ref 8.6–10.4)
Chloride: 106 mmol/L (ref 98–110)
Creat: 0.97 mg/dL — ABNORMAL HIGH (ref 0.60–0.93)
GFR, Est African American: 68 mL/min/{1.73_m2} (ref >=60)
GFR, Est Non African American: 59 mL/min/{1.73_m2} — ABNORMAL LOW (ref >=60)
Globulin: 3.2 g/dL (calc) (ref 1.9–3.7)
Glucose, Bld: 107 mg/dL — ABNORMAL HIGH (ref 65–99)
Potassium: 4 mmol/L (ref 3.5–5.3)
Sodium: 141 mmol/L (ref 135–146)
Total Bilirubin: 0.5 mg/dL (ref 0.2–1.2)
Total Protein: 7.2 g/dL (ref 6.1–8.1)

## 2019-05-07 LAB — HEMOGLOBIN A1C
Hgb A1c MFr Bld: 5.9 % of total Hgb — ABNORMAL HIGH (ref <5.7)
Mean Plasma Glucose: 123 (calc)
eAG (mmol/L): 6.8 (calc)

## 2019-05-07 LAB — CBC
HCT: 42.1 % (ref 35.0–45.0)
Hemoglobin: 13.9 g/dL (ref 11.7–15.5)
MCH: 26.9 pg — ABNORMAL LOW (ref 27.0–33.0)
MCHC: 33 g/dL (ref 32.0–36.0)
MCV: 81.4 fL (ref 80.0–100.0)
MPV: 10 fL (ref 7.5–12.5)
Platelets: 205 10*3/uL (ref 140–400)
RBC: 5.17 10*6/uL — ABNORMAL HIGH (ref 3.80–5.10)
RDW: 13.6 % (ref 11.0–15.0)
WBC: 3.5 10*3/uL — ABNORMAL LOW (ref 3.8–10.8)

## 2019-05-07 LAB — VITAMIN D 25 HYDROXY (VIT D DEFICIENCY, FRACTURES): Vit D, 25-Hydroxy: 18 ng/mL — ABNORMAL LOW (ref 30–100)

## 2019-05-07 LAB — TSH: TSH: 1.69 mIU/L (ref 0.40–4.50)

## 2019-05-07 MED ORDER — ERGOCALCIFEROL 1.25 MG (50000 UT) PO CAPS: 50000.0000 [IU] | ORAL_CAPSULE | ORAL | 1 refills | Status: DC

## 2019-05-07 NOTE — Telephone Encounter (Signed)
I10 was listed on the diagnoses and there is nothing else that qualifies her, no abnormal tsh or meds other than BP

## 2019-05-13 ENCOUNTER — Encounter: Payer: Self-pay | Admitting: Family Medicine

## 2019-05-13 ENCOUNTER — Other Ambulatory Visit: Payer: Self-pay

## 2019-05-13 ENCOUNTER — Ambulatory Visit (INDEPENDENT_AMBULATORY_CARE_PROVIDER_SITE_OTHER): Payer: BC Managed Care – PPO | Admitting: Family Medicine

## 2019-05-13 ENCOUNTER — Ambulatory Visit: Payer: BC Managed Care – PPO | Admitting: Family Medicine

## 2019-05-13 VITALS — BP 142/82 | HR 85 | Temp 97.8°F | Ht 59.0 in | Wt 151.4 lb

## 2019-05-13 DIAGNOSIS — E559 Vitamin D deficiency, unspecified: Secondary | ICD-10-CM

## 2019-05-13 DIAGNOSIS — R7303 Prediabetes: Secondary | ICD-10-CM | POA: Diagnosis not present

## 2019-05-13 DIAGNOSIS — M25532 Pain in left wrist: Secondary | ICD-10-CM

## 2019-05-13 DIAGNOSIS — I10 Essential (primary) hypertension: Secondary | ICD-10-CM

## 2019-05-13 NOTE — Patient Instructions (Addendum)
Wrist splint  Volaren gel-over the counter Wrist Sprain, Adult A wrist sprain is a stretch or tear in the strong, fibrous tissues (ligaments) that connect your wrist bones. There are three types of wrist sprains:  Grade 1. In this type of sprain, the ligament is stretched more than normal.  Grade 2. In this type of sprain, the ligament is partially torn. You may be able to move your wrist, but not very much.  Grade 3. In this type of sprain, the ligament or muscle is completely torn. You may find it difficult or extremely painful to move your wrist even a little. What are the causes? A wrist sprain can be caused by using the wrist too much during sports, exercise, or at work. It can also happen with a fall or during an accident. What increases the risk? This condition is more likely to occur in people:  With a previous wrist or arm injury.  With poor wrist strength and flexibility.  Who play contact sports, such as football or soccer.  Who play sports that may result in a fall, such as skateboarding, biking, skiing, or snowboarding.  Who do not exercise regularly.  Who use exercise equipment that does not fit well. What are the signs or symptoms? Symptoms of this condition include:  Pain in the wrist, arm, or hand.  Swelling or bruised skin near the wrist, hand, or arm. The skin may look yellow or kind of blue.  Stiffness or trouble moving the hand.  Hearing a pop or feeling a tear at the time of the injury.  A warm feeling in the skin around the wrist. How is this diagnosed? This condition is diagnosed with a physical exam. Sometimes an X-ray is taken to make sure a bone did not break. If your health care provider thinks that you tore a ligament, he or she may order an MRI of your wrist. How is this treated? This condition is treated by resting and applying ice to your wrist. Additional treatment may include:  Medicine for pain and inflammation.  A splint to keep your  wrist still (immobilized).  Exercises to strengthen and stretch your wrist.  Surgery. This may be done if the ligament is completely torn. Follow these instructions at home: If you have a splint:   Do not put pressure on any part of the splint until it is fully hardened. This may take several hours.  Wear the splint as told by your health care provider. Remove it only as told by your health care provider.  Loosen the splint if your fingers tingle, become numb, or turn cold and blue.  If your splint is not waterproof: ? Do not let it get wet. ? Cover it with a watertight covering when you take a bath or a shower.  Keep the splint clean. Managing pain, stiffness, and swelling   If directed, put ice on the injured area. ? If you have a removable splint, remove it as told by your health care provider. ? Put ice in a plastic bag. ? Place a towel between your skin and the bag or between the splint and the bag. ? Leave the ice on for 20 minutes, 2-3 times per day.  Move your fingers often to avoid stiffness and to lessen swelling.  Raise (elevate) the injured area above the level of your heart while you are sitting or lying down. Activity  Rest your wrist. Do not do things that cause pain.  Return to your normal activities  as told by your health care provider. Ask your health care provider what activities are safe for you.  Do exercises as told by your health care provider. General instructions  Take over-the-counter and prescription medicines only as told by your health care provider.  Do not use any products that contain nicotine or tobacco, such as cigarettes and e-cigarettes. These can delay healing. If you need help quitting, ask your health care provider.  Ask your health care provider when it is safe to drive if you have a splint.  Keep all follow-up visits as told by your health care provider. This is important. Contact a health care provider if:  Your pain,  bruising, or swelling gets worse.  Your skin becomes red, gets a rash, or has open sores.  Your pain does not get better or it gets worse. Get help right away if:  You have a new or sudden sharp pain in the hand, arm, or wrist.  You have tingling or numbness in your hand.  Your fingers turn white, very red, or cold and blue.  You cannot move your fingers. This information is not intended to replace advice given to you by your health care provider. Make sure you discuss any questions you have with your health care provider. Document Revised: 12/30/2016 Document Reviewed: 08/06/2015 Elsevier Patient Education  2020 [employer] Inc.    COVID-19 Vaccine Information can be found at: [email or web address][employer]nl For questions related to vaccine distribution or appointments, please email [email or web address] or call [phone].     If you have lab work done today you will be contacted with your lab results within the next 2 weeks.  If you have not heard from [place] then please contact us. The fastest way to get your results is to register for My Chart.   IF you received an x-ray today, you will receive an invoice from [facility]. Please contact [facility] at [phone] with questions or concerns regarding your invoice.   IF you received labwork today, you will receive an invoice from [facility]. Please contact [facility] at [phone] with questions or concerns regarding your invoice.   Our billing staff will not be able to assist you with questions regarding bills from these companies.  You will be contacted with the lab results as soon as they are available. The fastest way to get your results is to activate your My Chart account. Instructions are located on the last page of this paperwork. If you have not heard from [place] regarding the results in 2 weeks, please contact this office.
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
## 2019-05-23 ENCOUNTER — Ambulatory Visit: Payer: BC Managed Care – PPO | Admitting: Family Medicine

## 2019-06-06 ENCOUNTER — Other Ambulatory Visit: Payer: Self-pay | Admitting: *Deleted

## 2019-06-06 MED ORDER — AMLODIPINE BESYLATE 5 MG PO TABS: 5.0000 mg | ORAL_TABLET | Freq: Every day | ORAL | 3 refills | Status: DC

## 2019-07-29 ENCOUNTER — Other Ambulatory Visit: Payer: Self-pay

## 2019-07-29 ENCOUNTER — Ambulatory Visit (INDEPENDENT_AMBULATORY_CARE_PROVIDER_SITE_OTHER): Payer: BC Managed Care – PPO | Admitting: Family Medicine

## 2019-07-29 ENCOUNTER — Encounter: Payer: Self-pay | Admitting: Family Medicine

## 2019-07-29 VITALS — BP 116/79 | HR 65 | Temp 97.3°F | Resp 18 | Ht 59.0 in | Wt 150.4 lb

## 2019-07-29 DIAGNOSIS — Z0000 Encounter for general adult medical examination without abnormal findings: Secondary | ICD-10-CM | POA: Diagnosis not present

## 2019-07-29 DIAGNOSIS — Z1231 Encounter for screening mammogram for malignant neoplasm of breast: Secondary | ICD-10-CM | POA: Diagnosis not present

## 2019-07-29 DIAGNOSIS — I10 Essential (primary) hypertension: Secondary | ICD-10-CM

## 2019-07-29 NOTE — Patient Instructions (Signed)
F/Un with MD in January, call if you need me before  No med changes  Fasting chem7 and EGFR in early November   Come for flu vaccine in September, call for appt  Pleaasee reconsider covid vaccines  It is important that you exercise regularly at least 30 minutes 5 times a week. If you develop chest pain, have severe difficulty breathing, or feel very tired, stop exercising immediately and seek medical attention   Think about what you will eat, plan ahead. Choose " clean, green, fresh or frozen" over canned, processed or packaged foods which are more sugary, salty and fatty. 70 to 75% of food eaten should be vegetables and fruit. Three meals at set times with snacks allowed between meals, but they must be fruit or vegetables. Aim to eat over a 12 hour period , example 7 am to 7 pm, and STOP after  your last meal of the day. Drink water,generally about 64 ounces per day, no other drink is as healthy. Fruit juice is best enjoyed in a healthy way, by EATING the fruit. Thanks for choosing [facility], we consider it a privelige to serve you.

## 2019-07-29 NOTE — Progress Notes (Signed)
    Amber Fernandez     MRN: [id number]      DOB: [birth year]  HPI: Patient is in for annual physical exam. No other health concerns are expressed or addressed at the visit. Recent labs, if available are reviewed. Immunization is reviewed , still holding off on Covid vaccine.   PE: BP 116/79 (BP Location: Left Arm, Patient Position: Sitting, Cuff Size: Normal)   Pulse 65   Temp (!) 97.3 F (36.3 C) (Temporal)   Resp 18   Ht 4\' 11"  (1.499 m)   Wt 150 lb 6.4 oz (68.2 kg)   SpO2 95%   BMI 30.38 kg/m   Pleasant  female, alert and oriented x 3, in no cardio-pulmonary distress. Afebrile. HEENT No facial trauma or asymetry. Sinuses non tender.  Extra occullar muscles intact.. External ears normal, . Neck: supple, no adenopathy,JVD or thyromegaly.No bruits.  Chest: Clear to ascultation bilaterally.No crackles or wheezes. Non tender to palpation  Breast: No asymetry,no masses or lumps. No tenderness. No nipple discharge or inversion. No axillary or supraclavicular adenopathy  Cardiovascular system; Heart sounds normal,  S1 and  S2 ,no S3.  No murmur, or thrill. Apical beat not displaced Peripheral pulses normal.  Abdomen: Soft, non tender, no organomegaly or masses. No bruits. Bowel sounds normal. No guarding, tenderness or rebound.   GU: Not examined, asymptomatic   Musculoskeletal exam: Full ROM of spine, hips , shoulders and reduced in left  knee. Mild deformity ,swelling and  crepitus noted.of left knee No muscle wasting or atrophy.   Neurologic: Cranial nerves 2 to 12 intact. Power, tone ,sensation and reflexes normal throughout. No disturbance in gait. No tremor.  Skin: Intact, no ulceration, erythema , scaling or rash noted. Pigmentation normal throughout  Psych; Normal mood and affect. Judgement and concentration normal   Assessment & Plan:  Annual physical exam Annual exam as documented. Counseling done  re healthy lifestyle involving commitment  to 150 minutes exercise per week, heart healthy diet, and attaining healthy weight.The importance of adequate sleep also discussed. Regular seat belt use and home safety, is also discussed. Changes in health habits are decided on by the patient with goals and time frames  set for achieving them. Immunization and cancer screening needs are specifically addressed at this visit.

## 2019-07-29 NOTE — Assessment & Plan Note (Signed)

## 2019-09-27 ENCOUNTER — Other Ambulatory Visit: Payer: Self-pay | Admitting: Family Medicine

## 2019-10-03 ENCOUNTER — Encounter: Payer: Self-pay | Admitting: Family Medicine

## 2019-10-03 ENCOUNTER — Other Ambulatory Visit (HOSPITAL_COMMUNITY)
Admission: RE | Admit: 2019-10-03 | Discharge: 2019-10-03 | Disposition: A | Discharge status: Home / Self Care | Payer: BC Managed Care – PPO | Source: Ambulatory Visit | Attending: Family Medicine | Admitting: Family Medicine

## 2019-10-03 ENCOUNTER — Ambulatory Visit (INDEPENDENT_AMBULATORY_CARE_PROVIDER_SITE_OTHER): Payer: BC Managed Care – PPO | Admitting: Family Medicine

## 2019-10-03 ENCOUNTER — Other Ambulatory Visit: Payer: Self-pay

## 2019-10-03 VITALS — BP 120/80 | HR 95 | Resp 16 | Ht 59.0 in | Wt 151.0 lb

## 2019-10-03 DIAGNOSIS — I10 Essential (primary) hypertension: Secondary | ICD-10-CM | POA: Diagnosis not present

## 2019-10-03 DIAGNOSIS — N760 Acute vaginitis: Secondary | ICD-10-CM | POA: Insufficient documentation

## 2019-10-03 DIAGNOSIS — Z23 Encounter for immunization: Secondary | ICD-10-CM

## 2019-10-03 NOTE — Patient Instructions (Signed)
Follow-up as before call if you need me sooner.  Excellent blood pressure.  Although you feel you are losing weight your weight has been stable for the past 1 year.  I do believe you are just becoming increasingly stressed out because of what is happening generally however overall handling it fairly well.  Flu vaccine in office today.  Please do go ahead and get your Covid vaccines in the middle to the end of this month    Specimens will be sent today to test for vaginal  infection due to your complaint , we will call you with results  Thanks for choosing [facility], we consider it a privelige to serve you.

## 2019-10-03 NOTE — Assessment & Plan Note (Signed)
Controlled, no change in medication DASH diet and commitment to daily physical activity for a minimum of 30 minutes discussed and encouraged, as a part of hypertension management. The importance of attaining a healthy weight is also discussed.  BP/Weight 10/03/2019 07/29/2019 05/13/2019 04/16/2019 11/26/2018 10/15/2018 06/22/2018  Systolic BP 120 116 142 144 120 160 136  Diastolic BP 80 79 82 71 76 82 80  Wt. (Lbs) 151 150.4 151.4 151.2 149.12 148 158  BMI 30.5 30.38 30.58 30.54 30.12 29.89 31.91

## 2019-10-07 ENCOUNTER — Encounter: Payer: Self-pay | Admitting: Family Medicine

## 2019-10-07 DIAGNOSIS — N760 Acute vaginitis: Secondary | ICD-10-CM | POA: Insufficient documentation

## 2019-10-07 NOTE — Assessment & Plan Note (Signed)
Symptomatic , specimen sent for testing

## 2019-10-07 NOTE — Progress Notes (Signed)
   Amber Fernandez     MRN: [id number]      DOB: [birth year]   HPI Amber Fernandez is here c/o poor appetite and weight loss, states nerves are stressed with  ongoing Covid and need to self isolate 1 week h/o increased vaginal d/c , not sexually active for over 2 years, no odor , no itch  ROS Denies recent fever or chills. Denies sinus pressure, nasal congestion, ear pain or sore throat. Denies chest congestion, productive cough or wheezing. Denies chest pains, palpitations and leg swelling Denies abdominal pain, nausea, vomiting,diarrhea or constipation.   Denies dysuria, frequency, hesitancy or incontinence. Denies joint pain, swelling and limitation in mobility. Denies headaches, seizures, numbness, or tingling. Denies depression, anxiety or insomnia. Denies skin break down or rash.   PE  BP 120/80   Pulse 95   Resp 16   Ht 4\' 11"  (1.499 m)   Wt 151 lb (68.5 kg)   SpO2 97%   BMI 30.50 kg/m   Patient alert and oriented and in no cardiopulmonary distress.  HEENT: No facial asymmetry, EOMI,     Neck supple .  Chest: Clear to auscultation bilaterally.  CVS: S1, S2 no murmurs, no S3.Regular rate.  ABD: Soft non tender.   Ext: No edema  MS: Adequate ROM spine, shoulders, hips and knees.  Skin: Intact, no ulcerations or rash noted.  Psych: Good eye contact, normal affect. Memory intact not anxious or depressed appearing.  CNS: CN 2-12 intact, power,  normal throughout.no focal deficits noted.   Assessment & Plan  Essential hypertension Controlled, no change in medication DASH diet and commitment to daily physical activity for a minimum of 30 minutes discussed and encouraged, as a part of hypertension management. The importance of attaining a healthy weight is also discussed.  BP/Weight 10/03/2019 07/29/2019 05/13/2019 04/16/2019 11/26/2018 10/15/2018 06/22/2018  Systolic BP 120 116 142 144 120 160 136  Diastolic BP 80 79 82 71 76 82 80  Wt. (Lbs) 151 150.4 151.4 151.2  149.12 148 158  BMI 30.5 30.38 30.58 30.54 30.12 29.89 31.91       Vaginitis and vulvovaginitis Symptomatic , specimen sent for testing

## 2019-10-08 LAB — CERVICOVAGINAL ANCILLARY ONLY
Bacterial Vaginitis (gardnerella): NEGATIVE
Candida Glabrata: NEGATIVE
Candida Vaginitis: NEGATIVE
Chlamydia: NEGATIVE
Comment: NEGATIVE
Comment: NEGATIVE
Comment: NEGATIVE
Comment: NEGATIVE
Comment: NEGATIVE
Comment: NORMAL
Neisseria Gonorrhea: NEGATIVE
Trichomonas: NEGATIVE

## 2019-11-01 ENCOUNTER — Other Ambulatory Visit: Payer: Self-pay | Admitting: Family Medicine

## 2019-12-04 LAB — BASIC METABOLIC PANEL WITH GFR
BUN: 13 mg/dL (ref 7–25)
CO2: 29 mmol/L (ref 20–32)
Calcium: 9.6 mg/dL (ref 8.6–10.4)
Chloride: 105 mmol/L (ref 98–110)
Creat: 0.86 mg/dL (ref 0.60–0.93)
GFR, Est African American: 78 mL/min/{1.73_m2} (ref >=60)
GFR, Est Non African American: 67 mL/min/{1.73_m2} (ref >=60)
Glucose, Bld: 101 mg/dL — ABNORMAL HIGH (ref 65–99)
Potassium: 4.3 mmol/L (ref 3.5–5.3)
Sodium: 141 mmol/L (ref 135–146)

## 2019-12-06 ENCOUNTER — Other Ambulatory Visit: Payer: Self-pay

## 2019-12-06 ENCOUNTER — Ambulatory Visit
Admission: RE | Admit: 2019-12-06 | Discharge: 2019-12-06 | Disposition: A | Discharge status: Home / Self Care | Payer: BC Managed Care – PPO | Source: Ambulatory Visit | Attending: Family Medicine | Admitting: Family Medicine

## 2019-12-06 DIAGNOSIS — Z1231 Encounter for screening mammogram for malignant neoplasm of breast: Secondary | ICD-10-CM

## 2020-02-03 ENCOUNTER — Other Ambulatory Visit: Payer: Self-pay

## 2020-02-03 ENCOUNTER — Ambulatory Visit (INDEPENDENT_AMBULATORY_CARE_PROVIDER_SITE_OTHER): Payer: Medicare HMO | Admitting: Family Medicine

## 2020-02-03 ENCOUNTER — Encounter: Payer: Self-pay | Admitting: Family Medicine

## 2020-02-03 VITALS — BP 134/85 | HR 76 | Resp 15 | Ht 59.0 in | Wt 148.0 lb

## 2020-02-03 DIAGNOSIS — I10 Essential (primary) hypertension: Secondary | ICD-10-CM | POA: Diagnosis not present

## 2020-02-03 DIAGNOSIS — F321 Major depressive disorder, single episode, moderate: Secondary | ICD-10-CM | POA: Diagnosis not present

## 2020-02-03 DIAGNOSIS — E663 Overweight: Secondary | ICD-10-CM

## 2020-02-03 MED ORDER — SERTRALINE HCL 25 MG PO TABS: 25.0000 mg | ORAL_TABLET | Freq: Every day | ORAL | 2 refills | Status: DC

## 2020-02-03 MED ORDER — HYDROXYZINE HCL 10 MG PO TABS: ORAL_TABLET | ORAL | 0 refills | Status: DC

## 2020-02-03 NOTE — Patient Instructions (Addendum)
F/U in 2 months, call if you need me sooner  New for depression and anxiety is once daily sertraline ( zoloft)  For [place] at bedtime if unable to sleep is hydroxyzine one tablet  Please call for referral for therapy if you  Feel you need this  Please get covid vaccines, they are protective against bad covid infection  Best for 2022

## 2020-02-03 NOTE — Assessment & Plan Note (Signed)
Not suicidal or homicidal, no interest in therapy currently. Start sertraline 25 mg daily, re eval in 2 months

## 2020-02-03 NOTE — Assessment & Plan Note (Signed)
  Patient re-educated about  the importance of commitment to a  minimum of 150 minutes of exercise per week as able.  The importance of healthy food choices with portion control discussed, as well as eating regularly and within a 12 hour window most days. The need to choose "clean , green" food 50 to 75% of the time is discussed, as well as to make water the primary drink and set a goal of 64 ounces water daily.    Weight /BMI 02/03/2020 10/03/2019 07/29/2019  WEIGHT 148 lb 151 lb 150 lb 6.4 oz  HEIGHT 4\' 11"  4\' 11"  4\' 11"   BMI 29.89 kg/m2 30.5 kg/m2 30.38 kg/m2

## 2020-02-03 NOTE — Progress Notes (Signed)
   Amber Fernandez     MRN: [id number]      DOB: [birth year]   HPI Amber Fernandez is here for follow up of ED visit last week, when she presented with depression and anxiety. Feels better than she did. Was treated with hydroxyzine, and is incapable of taking them while working as they are sedating. Overwhelmed over the holiday real ling loss of her mom also lost a sister, house needing repairs and other issues. Called for help and was taken by her family and friend Has benefited in the past from SSRI   ROS Denies recent fever or chills. Denies sinus pressure, nasal congestion, ear pain or sore throat. Denies chest congestion, productive cough or wheezing. Denies chest pains, palpitations and leg swelling Denies abdominal pain, nausea, vomiting,diarrhea or constipation.   Denies dysuria, frequency, hesitancy or incontinence. Denies joint pain, swelling and limitation in mobility. Denies headaches, seizures, numbness, or tingling.  Denies skin break down or rash.   PE  BP 134/85   Pulse 76   Resp 15   Ht 4\' 11"  (1.499 m)   Wt 148 lb (67.1 kg)   SpO2 98%   BMI 29.89 kg/m   Patient alert and oriented and in no cardiopulmonary distress.  HEENT: No facial asymmetry, EOMI,     Neck supple .  Chest: Clear to auscultation bilaterally.  CVS: S1, S2 no murmurs, no S3.Regular rate.  ABD: Soft non tender.   Ext: No edema  MS: Adequate ROM spine, shoulders, hips and knees.  Skin: Intact, no ulcerations or rash noted.  Psych: Good eye contact, normal affect. Memory intact not anxious or depressed appearing.  CNS: CN 2-12 intact, power,  normal throughout.no focal deficits noted.   Assessment & Plan  Depression, major, single episode, moderate (HCC) Not suicidal or homicidal, no interest in therapy currently. Start sertraline 25 mg daily, re eval in 2 months  Essential hypertension Controlled, no change in medication DASH diet and commitment to daily physical activity for a  minimum of 30 minutes discussed and encouraged, as a part of hypertension management. The importance of attaining a healthy weight is also discussed.  BP/Weight 02/03/2020 10/03/2019 07/29/2019 05/13/2019 04/16/2019 11/26/2018 10/15/2018  Systolic BP 134 120 116 142 144 120 160  Diastolic BP 85 80 79 82 71 76 82  Wt. (Lbs) 148 151 150.4 151.4 151.2 149.12 148  BMI 29.89 30.5 30.38 30.58 30.54 30.12 29.89       Overweight (BMI 25.0-29.9)  Patient re-educated about  the importance of commitment to a  minimum of 150 minutes of exercise per week as able.  The importance of healthy food choices with portion control discussed, as well as eating regularly and within a 12 hour window most days. The need to choose "clean , green" food 50 to 75% of the time is discussed, as well as to make water the primary drink and set a goal of 64 ounces water daily.    Weight /BMI 02/03/2020 10/03/2019 07/29/2019  WEIGHT 148 lb 151 lb 150 lb 6.4 oz  HEIGHT 4\' 11"  4\' 11"  4\' 11"   BMI 29.89 kg/m2 30.5 kg/m2 30.38 kg/m2

## 2020-02-03 NOTE — Assessment & Plan Note (Signed)
Controlled, no change in medication DASH diet and commitment to daily physical activity for a minimum of 30 minutes discussed and encouraged, as a part of hypertension management. The importance of attaining a healthy weight is also discussed.  BP/Weight 02/03/2020 10/03/2019 07/29/2019 05/13/2019 04/16/2019 11/26/2018 10/15/2018  Systolic BP 134 120 116 142 144 120 160  Diastolic BP 85 80 79 82 71 76 82  Wt. (Lbs) 148 151 150.4 151.4 151.2 149.12 148  BMI 29.89 30.5 30.38 30.58 30.54 30.12 29.89

## 2020-02-17 ENCOUNTER — Other Ambulatory Visit: Payer: Self-pay

## 2020-02-17 ENCOUNTER — Encounter: Payer: Self-pay | Admitting: Family Medicine

## 2020-02-17 ENCOUNTER — Telehealth (INDEPENDENT_AMBULATORY_CARE_PROVIDER_SITE_OTHER): Payer: Medicare HMO | Admitting: Family Medicine

## 2020-02-17 VITALS — BP 120/81

## 2020-02-17 DIAGNOSIS — U071 COVID-19: Secondary | ICD-10-CM | POA: Insufficient documentation

## 2020-02-17 DIAGNOSIS — I10 Essential (primary) hypertension: Secondary | ICD-10-CM

## 2020-02-17 DIAGNOSIS — J209 Acute bronchitis, unspecified: Secondary | ICD-10-CM

## 2020-02-17 MED ORDER — AZITHROMYCIN 250 MG PO TABS: ORAL_TABLET | ORAL | 0 refills | Status: DC

## 2020-02-17 MED ORDER — BENZONATATE 100 MG PO CAPS: 100.0000 mg | ORAL_CAPSULE | Freq: Two times a day (BID) | ORAL | 0 refills | Status: DC | PRN

## 2020-02-17 NOTE — Progress Notes (Signed)
Virtual Visit via Telephone Note  I connected with Amber Fernandez on 02/17/20 at 11:00 AM EST by telephone and verified that I am speaking with the correct person using two identifiers.  Location: Patient: home Provider: work   I discussed the limitations, risks, security and privacy concerns of performing an evaluation and management service by telephone and the availability of in person appointments. I also discussed with the patient that there may be a patient responsible charge related to this service. The patient expressed understanding and agreed to proceed.   History of Present Illness: Reports that she went for covid testing with no symptoms , 01/10 and was informed 2 days later that she was positive, 3 other family members are positive. She got a message on the job , re positive status , and left the workplace. Started coughing 2 days ago, a lot of phlegm, milky , brown  Washed mouth  with listerene, eating chicken noodle soup, temp to 101, 3 days ago.No sinus pressure or ear pain, legs were hurting 2 days ago , not today, no additional temperature, currently has a productive cough   Observations/Objective: BP 120/81  Good communication with no confusion and intact memory. Alert and oriented x 3 No signs of respiratory distress during speech     Assessment and Plan: COVID-19 virus RNA test result positive at limit of detection Positive test reported on 10/12 with very few symptoms at presentation. Reporting brown sputum and fever over the weekend  Azithromycin and tessalon perles prescribed Return to work on 02/20/2020    Acute bronchitis Z pack and tessalon perles prescribed, work excuse to return on 02/20/2020  Essential hypertension DASH diet and commitment to daily physical activity for a minimum of 30 minutes discussed and encouraged, as a part of hypertension management. The importance of attaining a healthy weight is also discussed.  BP/Weight 02/17/2020  02/03/2020 10/03/2019 07/29/2019 05/13/2019 04/16/2019 11/26/2018  Systolic BP 120 134 120 116 142 144 120  Diastolic BP 81 85 80 79 82 71 76  Wt. (Lbs) - 148 151 150.4 151.4 151.2 149.12  BMI - 29.89 30.5 30.38 30.58 30.54 30.12    Controlled , when checked     Follow Up Instructions:    I discussed the assessment and treatment plan with the patient. The patient was provided an opportunity to ask questions and all were answered. The patient agreed with the plan and demonstrated an understanding of the instructions.   The patient was advised to call back or seek an in-person evaluation if the symptoms worsen or if the condition fails to improve as anticipated.  I provided 15 minutes of non-face-to-face time during this encounter.   Syliva Overman, MD

## 2020-02-17 NOTE — Patient Instructions (Addendum)
F/U as before, call if you need me sooner  Please provide a copy of your covid test result when able , for your medical record  For your current cough, Z pack and tessalon perles are prescribed  Please do get the covid vaccines when you are able to   I hope that you continue to improve  Take tylenol 1 to 2 tablets as  Needed, dally for headache  Best for 2022!

## 2020-02-17 NOTE — Assessment & Plan Note (Signed)
Z pack and tessalon perles prescribed, work excuse to return on 02/20/2020

## 2020-02-17 NOTE — Assessment & Plan Note (Signed)
DASH diet and commitment to daily physical activity for a minimum of 30 minutes discussed and encouraged, as a part of hypertension management. The importance of attaining a healthy weight is also discussed.  BP/Weight 02/17/2020 02/03/2020 10/03/2019 07/29/2019 05/13/2019 04/16/2019 11/26/2018  Systolic BP 120 134 120 116 142 144 120  Diastolic BP 81 85 80 79 82 71 76  Wt. (Lbs) - 148 151 150.4 151.4 151.2 149.12  BMI - 29.89 30.5 30.38 30.58 30.54 30.12    Controlled , when checked

## 2020-02-17 NOTE — Assessment & Plan Note (Addendum)
Positive test reported on 10/12 with very few symptoms at presentation. Reporting brown sputum and fever over the weekend  Azithromycin and tessalon perles prescribed Return to work on 02/20/2020

## 2020-02-18 DIAGNOSIS — J1282 Pneumonia due to coronavirus disease 2019: Secondary | ICD-10-CM | POA: Diagnosis not present

## 2020-02-18 DIAGNOSIS — R509 Fever, unspecified: Secondary | ICD-10-CM | POA: Diagnosis not present

## 2020-02-18 DIAGNOSIS — U071 COVID-19: Secondary | ICD-10-CM | POA: Diagnosis not present

## 2020-02-18 DIAGNOSIS — R059 Cough, unspecified: Secondary | ICD-10-CM | POA: Diagnosis not present

## 2020-02-18 DIAGNOSIS — R9431 Abnormal electrocardiogram [ECG] [EKG]: Secondary | ICD-10-CM | POA: Diagnosis not present

## 2020-02-18 DIAGNOSIS — R112 Nausea with vomiting, unspecified: Secondary | ICD-10-CM | POA: Diagnosis not present

## 2020-02-18 DIAGNOSIS — I10 Essential (primary) hypertension: Secondary | ICD-10-CM | POA: Diagnosis not present

## 2020-02-19 ENCOUNTER — Telehealth: Payer: Self-pay

## 2020-02-19 NOTE — Telephone Encounter (Signed)
Transition Care Management Follow-up Telephone Call   Date discharged? 02/18/20   How have you been since you were released from the hospital? "doing a lot better"   Do you understand why you were in the hospital? yes   Do you understand the discharge instructions? yes   Where were you discharged to? home   Items Reviewed:  Medications reviewed: yes  Allergies reviewed: yes  Dietary changes reviewed: yes  Referrals reviewed: yes   Functional Questionnaire:   Activities of Daily Living (ADLs):   She states they are independent in the following: ambulation States they require assistance with the following: n/a   Any transportation issues/concerns?: no   Any patient concerns? no   Confirmed importance and date/time of follow-up visits scheduled yes  Provider Appointment booked with  Confirmed with patient if condition begins to worsen call PCP or go to the ER.  Patient was given the office number and encouraged to call back with question or concerns.  : yes

## 2020-02-23 DIAGNOSIS — J1282 Pneumonia due to coronavirus disease 2019: Secondary | ICD-10-CM | POA: Diagnosis not present

## 2020-02-23 DIAGNOSIS — E663 Overweight: Secondary | ICD-10-CM | POA: Diagnosis not present

## 2020-02-23 DIAGNOSIS — R0600 Dyspnea, unspecified: Secondary | ICD-10-CM | POA: Diagnosis not present

## 2020-02-23 DIAGNOSIS — R0602 Shortness of breath: Secondary | ICD-10-CM | POA: Diagnosis not present

## 2020-02-23 DIAGNOSIS — R9431 Abnormal electrocardiogram [ECG] [EKG]: Secondary | ICD-10-CM | POA: Diagnosis not present

## 2020-02-23 DIAGNOSIS — R4182 Altered mental status, unspecified: Secondary | ICD-10-CM | POA: Diagnosis not present

## 2020-02-23 DIAGNOSIS — I10 Essential (primary) hypertension: Secondary | ICD-10-CM | POA: Diagnosis not present

## 2020-02-23 DIAGNOSIS — R410 Disorientation, unspecified: Secondary | ICD-10-CM | POA: Diagnosis not present

## 2020-02-23 DIAGNOSIS — U071 COVID-19: Secondary | ICD-10-CM | POA: Diagnosis not present

## 2020-02-23 DIAGNOSIS — I959 Hypotension, unspecified: Secondary | ICD-10-CM | POA: Diagnosis not present

## 2020-02-23 DIAGNOSIS — R531 Weakness: Secondary | ICD-10-CM | POA: Diagnosis not present

## 2020-02-24 ENCOUNTER — Telehealth: Payer: Self-pay

## 2020-02-24 ENCOUNTER — Telehealth (INDEPENDENT_AMBULATORY_CARE_PROVIDER_SITE_OTHER): Payer: Medicare HMO | Admitting: Family Medicine

## 2020-02-24 ENCOUNTER — Encounter: Payer: Self-pay | Admitting: Family Medicine

## 2020-02-24 ENCOUNTER — Other Ambulatory Visit: Payer: Self-pay

## 2020-02-24 VITALS — Ht 59.0 in | Wt 148.0 lb

## 2020-02-24 DIAGNOSIS — J189 Pneumonia, unspecified organism: Secondary | ICD-10-CM | POA: Diagnosis not present

## 2020-02-24 DIAGNOSIS — Z09 Encounter for follow-up examination after completed treatment for conditions other than malignant neoplasm: Secondary | ICD-10-CM | POA: Diagnosis not present

## 2020-02-24 NOTE — Patient Instructions (Addendum)
F/U with MD first week in March, please cancel mid March appointment. Call if you need me sooner  Work excuse from 01/24 , return 03/02/2020  You are now bing treated for pneumonia, vital that you take medication prescribed in the ED yesterday, Levaquin and decadron  You need a rept CXR Feb 28, order is sent to [facility] in [place], important you get this done    Community-Acquired Pneumonia, Adult Pneumonia is an infection of the lungs. It causes irritation and swelling in the airways of the lungs. Mucus and fluid may also build up inside the airways. This may cause coughing and trouble breathing. One type of pneumonia can happen while you are in a hospital. A different type can happen when you are not in a hospital (community-acquired pneumonia). What are the causes? This condition is caused by germs (viruses, bacteria, or fungi). Some types of germs can spread from person to person. Pneumonia is not thought to spread from person to person.   What increases the risk? You are more likely to develop this condition if:  You have a long-term (chronic) disease, such as: ? Disease of the lungs. This may be chronic obstructive pulmonary disease (COPD) or asthma. ? Heart failure. ? Cystic fibrosis. ? Diabetes. ? Kidney disease. ? Sickle cell disease. ? HIV.  You have other health problems, such as: ? Your body's defense system (immune system) is weak. ? A condition that may cause you to breathe in fluids from your mouth and nose.  You had your spleen taken out.  You do not take good care of your teeth and mouth (poor dental hygiene).  You use or have used tobacco products.  You travel where the germs that cause this illness are common.  You are near certain animals or the places they live.  You are older than 73 years of age. What are the signs or symptoms? Symptoms of this condition include:  A cough.  A fever.  Sweating or chills.  Chest pain, often when  you breathe deeply or cough.  Breathing problems, such as: ? Fast breathing. ? Trouble breathing. ? Shortness of breath.  Feeling tired (fatigued).  Muscle aches. How is this treated? Treatment for this condition depends on many things, such as:  The cause of your illness.  Your medicines.  Your other health problems. Most adults can be treated at home. Sometimes, treatment must happen in a hospital.  Treatment may include medicines to kill germs.  Medicines may depend on which germ caused your illness. Very bad pneumonia is rare. If you get it, you may:  Have a machine to help you breathe.  Have fluid taken away from around your lungs. Follow these instructions at home: Medicines  Take over-the-counter and prescription medicines only as told by your doctor.  Take cough medicine only if you are losing sleep. Cough medicine can keep your body from taking mucus away from your lungs.  If you were prescribed an antibiotic medicine, take it as told by your doctor. Do not stop taking the antibiotic even if you start to feel better. Lifestyle  Do not drink alcohol.  Do not use any products that contain nicotine or tobacco, such as cigarettes, e-cigarettes, and chewing tobacco. If you need help quitting, ask your doctor.  Eat a healthy diet. This includes a lot of vegetables, fruits, whole grains, low-fat dairy products, and low-fat (lean) protein.      General instructions  Rest a lot. Sleep for at least  8 hours each night.  Sleep with your head and neck raised. Put a few pillows under your head or sleep in a reclining chair.  Return to your normal activities as told by your doctor. Ask your doctor what activities are safe for you.  Drink enough fluid to keep your pee (urine) pale yellow.  If your throat is sore, rinse your mouth often with salt water. To make salt water, dissolve -1 tsp (3-6 g) of salt in 1 cup (237 mL) of warm water.  Keep all follow-up visits as  told by your doctor. This is important.   How is this prevented? You can lower your risk of pneumonia by:  Getting the pneumonia shot (vaccine). These shots have different types and schedules. Ask your doctor what works best for you. Think about getting this shot if: ? You are older than 73 years of age. ? You are 91-38 years of age and:  You are being treated for cancer.  You have long-term lung disease.  You have other problems that affect your body's defense system. Ask your doctor if you have one of these.  Getting your flu shot every year. Ask your doctor which type of shot is best for you.  Going to the dentist as often as told.  Washing your hands often with soap and water for at least 20 seconds. If you cannot use soap and water, use hand sanitizer. Contact a doctor if:  You have a fever.  You lose sleep because your cough medicine does not help. Get help right away if:  You are short of breath and this gets worse.  You have more chest pain.  Your sickness gets worse. This is very serious if: ? You are an older adult. ? Your body's defense system is weak.  You cough up blood. These symptoms may be an emergency. Do not wait to see if the symptoms will go away. Get medical help right away. Call your local emergency services (911 in the [place] Do not drive yourself to the hospital. Summary  Pneumonia is an infection of the lungs.  Community-acquired pneumonia affects people who have not been in the hospital. Certain germs can cause this infection.  This condition may be treated with medicines that kill germs.  For very bad pneumonia, you may need a hospital stay and treatment to help with breathing. This information is not intended to replace advice given to you by your health care provider. Make sure you discuss any questions you have with your health care provider. Document Revised: 10/30/2018 Document Reviewed: 10/30/2018 Elsevier Patient Education  2021  [employer].

## 2020-02-24 NOTE — Progress Notes (Signed)
Virtual Visit via Telephone Note  I connected with Amber Fernandez on 02/24/20 at  2:00 PM EST by telephone and verified that I am speaking with the correct person using two identifiers.  Location: Patient: home Provider: work   I discussed the limitations, risks, security and privacy concerns of performing an evaluation and management service by telephone and the availability of in person appointments. I also discussed with the patient that there may be a patient responsible charge related to this service. The patient expressed understanding and agreed to proceed.   History of Present Illness: F/U from ED visit at [facility] with new additional dx of multifocal pneumonia, had been seen there once before on 02/18/2020 before and also dx covid positive on 02/13/2020 Pt tested for covid on 01/13, and negative on 02/20/2020. Chest scan on 01/23 shows multifocal pneumonia, currently on levaquin and decadron,prescribed at [facility] emergency room. no fever,had  chills denies  sputum. No SOB States already feeling much better    Observations/Objective: Ht 4\' 11"  (1.499 m)    Wt 148 lb (67.1 kg)    BMI 29.89 kg/m  Good communication with no confusion and intact memory. Alert and oriented x 3 No signs of respiratory distress during speech    Assessment and Plan: Encounter for examination following treatment at hospital Patient in for follow up of recent ED evaluations Discharge summary, and laboratory and radiology data are reviewed, and any questions or concerns  are discussed. Specific issues requiring follow up are specifically addressed.   Multifocal pneumonia levaquin and decadron prescribed in Ed, pt to complete course, rept CXR end feb, work excuse for an additional 1 week, ED if decompensates    Follow Up Instructions:    I discussed the assessment and treatment plan with the patient. The patient was provided an opportunity to ask questions and all were answered. The patient agreed with  the plan and demonstrated an understanding of the instructions.   The patient was advised to call back or seek an in-person evaluation if the symptoms worsen or if the condition fails to improve as anticipated.  I provided 25 minutes of non-face-to-face time during this encounter.   Mar, MD

## 2020-02-25 ENCOUNTER — Encounter: Payer: Self-pay | Admitting: Family Medicine

## 2020-02-25 ENCOUNTER — Other Ambulatory Visit: Payer: Self-pay | Admitting: Family Medicine

## 2020-02-25 DIAGNOSIS — J189 Pneumonia, unspecified organism: Secondary | ICD-10-CM | POA: Insufficient documentation

## 2020-02-25 DIAGNOSIS — Z09 Encounter for follow-up examination after completed treatment for conditions other than malignant neoplasm: Secondary | ICD-10-CM | POA: Insufficient documentation

## 2020-02-25 NOTE — Assessment & Plan Note (Signed)
levaquin and decadron prescribed in Ed, pt to complete course, rept CXR end feb, work excuse for an additional 1 week, ED if decompensates

## 2020-02-25 NOTE — Assessment & Plan Note (Signed)
Patient in for follow up of recent ED evaluations Discharge summary, and laboratory and radiology data are reviewed, and any questions or concerns  are discussed. Specific issues requiring follow up are specifically addressed.

## 2020-03-03 DIAGNOSIS — I639 Cerebral infarction, unspecified: Secondary | ICD-10-CM

## 2020-03-03 HISTORY — DX: Cerebral infarction, unspecified: I63.9

## 2020-03-13 DIAGNOSIS — I63219 Cerebral infarction due to unspecified occlusion or stenosis of unspecified vertebral arteries: Secondary | ICD-10-CM | POA: Diagnosis not present

## 2020-03-13 DIAGNOSIS — U071 COVID-19: Secondary | ICD-10-CM | POA: Diagnosis not present

## 2020-03-13 DIAGNOSIS — G8191 Hemiplegia, unspecified affecting right dominant side: Secondary | ICD-10-CM | POA: Diagnosis not present

## 2020-03-13 DIAGNOSIS — G459 Transient cerebral ischemic attack, unspecified: Secondary | ICD-10-CM | POA: Diagnosis not present

## 2020-03-13 DIAGNOSIS — I10 Essential (primary) hypertension: Secondary | ICD-10-CM | POA: Diagnosis not present

## 2020-03-13 DIAGNOSIS — Z885 Allergy status to narcotic agent status: Secondary | ICD-10-CM | POA: Diagnosis not present

## 2020-03-13 DIAGNOSIS — R4701 Aphasia: Secondary | ICD-10-CM | POA: Diagnosis not present

## 2020-03-13 DIAGNOSIS — M1990 Unspecified osteoarthritis, unspecified site: Secondary | ICD-10-CM | POA: Diagnosis not present

## 2020-03-13 DIAGNOSIS — I517 Cardiomegaly: Secondary | ICD-10-CM | POA: Diagnosis not present

## 2020-03-13 DIAGNOSIS — R29818 Other symptoms and signs involving the nervous system: Secondary | ICD-10-CM | POA: Diagnosis not present

## 2020-03-13 DIAGNOSIS — R531 Weakness: Secondary | ICD-10-CM | POA: Diagnosis not present

## 2020-03-13 DIAGNOSIS — I639 Cerebral infarction, unspecified: Secondary | ICD-10-CM | POA: Diagnosis not present

## 2020-03-13 DIAGNOSIS — R0689 Other abnormalities of breathing: Secondary | ICD-10-CM | POA: Diagnosis not present

## 2020-03-13 DIAGNOSIS — Z8673 Personal history of transient ischemic attack (TIA), and cerebral infarction without residual deficits: Secondary | ICD-10-CM | POA: Diagnosis not present

## 2020-03-13 DIAGNOSIS — R471 Dysarthria and anarthria: Secondary | ICD-10-CM | POA: Diagnosis not present

## 2020-03-13 DIAGNOSIS — I700 Atherosclerosis of aorta: Secondary | ICD-10-CM | POA: Diagnosis not present

## 2020-03-13 DIAGNOSIS — I959 Hypotension, unspecified: Secondary | ICD-10-CM | POA: Diagnosis not present

## 2020-03-13 DIAGNOSIS — R0600 Dyspnea, unspecified: Secondary | ICD-10-CM | POA: Diagnosis not present

## 2020-03-15 ENCOUNTER — Other Ambulatory Visit: Payer: Self-pay | Admitting: Family Medicine

## 2020-03-18 ENCOUNTER — Telehealth: Payer: Self-pay | Admitting: *Deleted

## 2020-03-18 NOTE — Telephone Encounter (Signed)
Transition Care Management Follow-up Telephone Call  Date of discharge and from where: 03-17-20 [facility]   How have you been since you were released from the hospital? She is doing ok  Any questions or concerns? Yes Cant use right side at all   Items Reviewed:  Did the pt receive and understand the discharge instructions provided? Yes   Medications obtained and verified? Yes   Other? No   Any new allergies since your discharge? Yes   Dietary orders reviewed? Yes  Do you have support at home? Yes   Home Care and Equipment/Supplies: Were home health services ordered? yes If so, what is the name of the agency? [facility]   Has the agency set up a time to come to the patient's home? yes Were any new equipment or medical supplies ordered?  No What is the name of the medical supply agency? [facility] Were you able to get the supplies/equipment? not applicable Do you have any questions related to the use of the equipment or supplies? No  Functional Questionnaire: (I = Independent and D = Dependent) ADLs: Has to have help  Bathing/Dressing- has to have help  Meal Prep- has to have help  Eating- has to have help  Maintaining continence- I  Transferring/Ambulation- has to have help  Managing Meds- I  Follow up appointments reviewed:   PCP Hospital f/u appt confirmed? Yes  Scheduled to see 03-23-20 on 9:40 @ [facility].  Specialist Hospital f/u appt confirmed? No    If their condition worsens, is the pt aware to call PCP or go to the Emergency Dept.? Yes  Was the patient provided with contact information for the PCP's office or ED? Yes  Was to pt encouraged to call back with questions or concerns? Yes

## 2020-03-22 DIAGNOSIS — U071 COVID-19: Secondary | ICD-10-CM | POA: Diagnosis not present

## 2020-03-22 DIAGNOSIS — M1990 Unspecified osteoarthritis, unspecified site: Secondary | ICD-10-CM | POA: Diagnosis not present

## 2020-03-22 DIAGNOSIS — R32 Unspecified urinary incontinence: Secondary | ICD-10-CM | POA: Diagnosis not present

## 2020-03-22 DIAGNOSIS — I69322 Dysarthria following cerebral infarction: Secondary | ICD-10-CM | POA: Diagnosis not present

## 2020-03-22 DIAGNOSIS — R3915 Urgency of urination: Secondary | ICD-10-CM | POA: Diagnosis not present

## 2020-03-22 DIAGNOSIS — I10 Essential (primary) hypertension: Secondary | ICD-10-CM | POA: Diagnosis not present

## 2020-03-22 DIAGNOSIS — I700 Atherosclerosis of aorta: Secondary | ICD-10-CM | POA: Diagnosis not present

## 2020-03-22 DIAGNOSIS — M47812 Spondylosis without myelopathy or radiculopathy, cervical region: Secondary | ICD-10-CM | POA: Diagnosis not present

## 2020-03-22 DIAGNOSIS — I69351 Hemiplegia and hemiparesis following cerebral infarction affecting right dominant side: Secondary | ICD-10-CM | POA: Diagnosis not present

## 2020-03-23 ENCOUNTER — Telehealth: Payer: Self-pay

## 2020-03-23 ENCOUNTER — Other Ambulatory Visit: Payer: Self-pay

## 2020-03-23 ENCOUNTER — Ambulatory Visit (INDEPENDENT_AMBULATORY_CARE_PROVIDER_SITE_OTHER): Payer: Medicare HMO | Admitting: Nurse Practitioner

## 2020-03-23 ENCOUNTER — Encounter: Payer: Self-pay | Admitting: Nurse Practitioner

## 2020-03-23 DIAGNOSIS — U071 COVID-19: Secondary | ICD-10-CM | POA: Diagnosis not present

## 2020-03-23 DIAGNOSIS — I6350 Cerebral infarction due to unspecified occlusion or stenosis of unspecified cerebral artery: Secondary | ICD-10-CM | POA: Insufficient documentation

## 2020-03-23 DIAGNOSIS — I69351 Hemiplegia and hemiparesis following cerebral infarction affecting right dominant side: Secondary | ICD-10-CM | POA: Diagnosis not present

## 2020-03-23 DIAGNOSIS — R3915 Urgency of urination: Secondary | ICD-10-CM | POA: Diagnosis not present

## 2020-03-23 DIAGNOSIS — R32 Unspecified urinary incontinence: Secondary | ICD-10-CM | POA: Diagnosis not present

## 2020-03-23 DIAGNOSIS — I10 Essential (primary) hypertension: Secondary | ICD-10-CM

## 2020-03-23 DIAGNOSIS — Z8673 Personal history of transient ischemic attack (TIA), and cerebral infarction without residual deficits: Secondary | ICD-10-CM | POA: Diagnosis not present

## 2020-03-23 DIAGNOSIS — I69322 Dysarthria following cerebral infarction: Secondary | ICD-10-CM | POA: Diagnosis not present

## 2020-03-23 DIAGNOSIS — M1990 Unspecified osteoarthritis, unspecified site: Secondary | ICD-10-CM | POA: Diagnosis not present

## 2020-03-23 DIAGNOSIS — M47812 Spondylosis without myelopathy or radiculopathy, cervical region: Secondary | ICD-10-CM | POA: Diagnosis not present

## 2020-03-23 DIAGNOSIS — I700 Atherosclerosis of aorta: Secondary | ICD-10-CM | POA: Diagnosis not present

## 2020-03-23 NOTE — Telephone Encounter (Signed)
Disability forms received  Copied Noted Sleeved  

## 2020-03-23 NOTE — Addendum Note (Signed)
Addended by: Bjorn Pippin on: 03/23/2020 10:20 AM   Modules accepted: Orders

## 2020-03-23 NOTE — Assessment & Plan Note (Addendum)
-  has consults to PT/OT, and SLP -she denies neuropathic pain at this time -on dual antiplatelet therapy- ASA and plavix -was started on atorvastatin at hospital -she would like neuro referral, and we can do that -she has disability paperwork to be filed

## 2020-03-23 NOTE — Telephone Encounter (Signed)
Amber Fernandez with [facility] needs Verbal for Speech Therapy 1 week times 4 weeks  [phone] can leave message on the VM
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
## 2020-03-23 NOTE — Assessment & Plan Note (Signed)
-  BP elevated today -she recently restarted amlodipine after CVA -no change to meds today, but will recheck in 1 month

## 2020-03-24 LAB — CBC WITH DIFFERENTIAL/PLATELET
Basophils Absolute: 0 10*3/uL (ref 0.0–0.2)
Basos: 1 %
EOS (ABSOLUTE): 0.1 10*3/uL (ref 0.0–0.4)
Eos: 2 %
Hematocrit: 44.2 % (ref 34.0–46.6)
Hemoglobin: 14.6 g/dL (ref 11.1–15.9)
Immature Grans (Abs): 0 10*3/uL (ref 0.0–0.1)
Immature Granulocytes: 0 %
Lymphocytes Absolute: 1.5 10*3/uL (ref 0.7–3.1)
Lymphs: 31 %
MCH: 26.7 pg (ref 26.6–33.0)
MCHC: 33 g/dL (ref 31.5–35.7)
MCV: 81 fL (ref 79–97)
Monocytes Absolute: 0.5 10*3/uL (ref 0.1–0.9)
Monocytes: 10 %
Neutrophils Absolute: 2.8 10*3/uL (ref 1.4–7.0)
Neutrophils: 56 %
Platelets: 234 10*3/uL (ref 150–450)
RBC: 5.46 x10E6/uL — ABNORMAL HIGH (ref 3.77–5.28)
RDW: 14.5 % (ref 11.7–15.4)
WBC: 4.9 10*3/uL (ref 3.4–10.8)

## 2020-03-24 LAB — BASIC METABOLIC PANEL
BUN/Creatinine Ratio: 12 (ref 12–28)
BUN: 11 mg/dL (ref 8–27)
CO2: 21 mmol/L (ref 20–29)
Calcium: 9.8 mg/dL (ref 8.7–10.3)
Chloride: 103 mmol/L (ref 96–106)
Creatinine, Ser: 0.91 mg/dL (ref 0.57–1.00)
GFR calc Af Amer: 73 mL/min/{1.73_m2} (ref >59)
GFR calc non Af Amer: 63 mL/min/{1.73_m2} (ref >59)
Glucose: 110 mg/dL — ABNORMAL HIGH (ref 65–99)
Potassium: 4.5 mmol/L (ref 3.5–5.2)
Sodium: 141 mmol/L (ref 134–144)

## 2020-03-24 NOTE — Telephone Encounter (Signed)
L/M informing Aldond of orders.

## 2020-03-24 NOTE — Progress Notes (Signed)
Her labs are good. Her RBC count is slightly elevated, but this is not new for her. We will monitor that with routine labs, but I'm not concerned about that.

## 2020-03-25 DIAGNOSIS — Z823 Family history of stroke: Secondary | ICD-10-CM

## 2020-03-26 DIAGNOSIS — I69351 Hemiplegia and hemiparesis following cerebral infarction affecting right dominant side: Secondary | ICD-10-CM | POA: Diagnosis not present

## 2020-03-26 DIAGNOSIS — I10 Essential (primary) hypertension: Secondary | ICD-10-CM | POA: Diagnosis not present

## 2020-03-26 DIAGNOSIS — I69322 Dysarthria following cerebral infarction: Secondary | ICD-10-CM | POA: Diagnosis not present

## 2020-03-26 DIAGNOSIS — M47812 Spondylosis without myelopathy or radiculopathy, cervical region: Secondary | ICD-10-CM | POA: Diagnosis not present

## 2020-03-26 DIAGNOSIS — I700 Atherosclerosis of aorta: Secondary | ICD-10-CM | POA: Diagnosis not present

## 2020-03-26 DIAGNOSIS — M1990 Unspecified osteoarthritis, unspecified site: Secondary | ICD-10-CM | POA: Diagnosis not present

## 2020-03-26 DIAGNOSIS — R3915 Urgency of urination: Secondary | ICD-10-CM | POA: Diagnosis not present

## 2020-03-26 DIAGNOSIS — R32 Unspecified urinary incontinence: Secondary | ICD-10-CM | POA: Diagnosis not present

## 2020-03-26 DIAGNOSIS — U071 COVID-19: Secondary | ICD-10-CM | POA: Diagnosis not present

## 2020-03-26 NOTE — Telephone Encounter (Signed)
complete

## 2020-03-27 DIAGNOSIS — M1990 Unspecified osteoarthritis, unspecified site: Secondary | ICD-10-CM | POA: Diagnosis not present

## 2020-03-27 DIAGNOSIS — I10 Essential (primary) hypertension: Secondary | ICD-10-CM | POA: Diagnosis not present

## 2020-03-27 DIAGNOSIS — R32 Unspecified urinary incontinence: Secondary | ICD-10-CM | POA: Diagnosis not present

## 2020-03-27 DIAGNOSIS — M47812 Spondylosis without myelopathy or radiculopathy, cervical region: Secondary | ICD-10-CM | POA: Diagnosis not present

## 2020-03-27 DIAGNOSIS — I69322 Dysarthria following cerebral infarction: Secondary | ICD-10-CM | POA: Diagnosis not present

## 2020-03-27 DIAGNOSIS — I700 Atherosclerosis of aorta: Secondary | ICD-10-CM | POA: Diagnosis not present

## 2020-03-27 DIAGNOSIS — R3915 Urgency of urination: Secondary | ICD-10-CM | POA: Diagnosis not present

## 2020-03-27 DIAGNOSIS — I69351 Hemiplegia and hemiparesis following cerebral infarction affecting right dominant side: Secondary | ICD-10-CM | POA: Diagnosis not present

## 2020-03-27 DIAGNOSIS — U071 COVID-19: Secondary | ICD-10-CM | POA: Diagnosis not present

## 2020-03-30 DIAGNOSIS — U071 COVID-19: Secondary | ICD-10-CM | POA: Diagnosis not present

## 2020-03-30 DIAGNOSIS — R3915 Urgency of urination: Secondary | ICD-10-CM | POA: Diagnosis not present

## 2020-03-30 DIAGNOSIS — M1990 Unspecified osteoarthritis, unspecified site: Secondary | ICD-10-CM | POA: Diagnosis not present

## 2020-03-30 DIAGNOSIS — R32 Unspecified urinary incontinence: Secondary | ICD-10-CM | POA: Diagnosis not present

## 2020-03-30 DIAGNOSIS — I700 Atherosclerosis of aorta: Secondary | ICD-10-CM | POA: Diagnosis not present

## 2020-03-30 DIAGNOSIS — I10 Essential (primary) hypertension: Secondary | ICD-10-CM | POA: Diagnosis not present

## 2020-03-30 DIAGNOSIS — I69351 Hemiplegia and hemiparesis following cerebral infarction affecting right dominant side: Secondary | ICD-10-CM | POA: Diagnosis not present

## 2020-03-30 DIAGNOSIS — M47812 Spondylosis without myelopathy or radiculopathy, cervical region: Secondary | ICD-10-CM | POA: Diagnosis not present

## 2020-03-30 DIAGNOSIS — I69322 Dysarthria following cerebral infarction: Secondary | ICD-10-CM | POA: Diagnosis not present

## 2020-04-02 ENCOUNTER — Telehealth: Payer: Self-pay

## 2020-04-02 ENCOUNTER — Other Ambulatory Visit: Payer: Self-pay

## 2020-04-02 ENCOUNTER — Telehealth (INDEPENDENT_AMBULATORY_CARE_PROVIDER_SITE_OTHER): Payer: Medicare HMO | Admitting: Family Medicine

## 2020-04-02 ENCOUNTER — Encounter: Payer: Self-pay | Admitting: Family Medicine

## 2020-04-02 VITALS — BP 142/80 | Ht 59.0 in | Wt 144.0 lb

## 2020-04-02 DIAGNOSIS — R3915 Urgency of urination: Secondary | ICD-10-CM | POA: Diagnosis not present

## 2020-04-02 DIAGNOSIS — F321 Major depressive disorder, single episode, moderate: Secondary | ICD-10-CM | POA: Diagnosis not present

## 2020-04-02 DIAGNOSIS — M1990 Unspecified osteoarthritis, unspecified site: Secondary | ICD-10-CM | POA: Diagnosis not present

## 2020-04-02 DIAGNOSIS — E8881 Metabolic syndrome: Secondary | ICD-10-CM

## 2020-04-02 DIAGNOSIS — R7301 Impaired fasting glucose: Secondary | ICD-10-CM | POA: Diagnosis not present

## 2020-04-02 DIAGNOSIS — I6350 Cerebral infarction due to unspecified occlusion or stenosis of unspecified cerebral artery: Secondary | ICD-10-CM

## 2020-04-02 DIAGNOSIS — I10 Essential (primary) hypertension: Secondary | ICD-10-CM | POA: Diagnosis not present

## 2020-04-02 DIAGNOSIS — U071 COVID-19: Secondary | ICD-10-CM | POA: Diagnosis not present

## 2020-04-02 DIAGNOSIS — R32 Unspecified urinary incontinence: Secondary | ICD-10-CM | POA: Diagnosis not present

## 2020-04-02 DIAGNOSIS — R7303 Prediabetes: Secondary | ICD-10-CM | POA: Diagnosis not present

## 2020-04-02 DIAGNOSIS — M47812 Spondylosis without myelopathy or radiculopathy, cervical region: Secondary | ICD-10-CM | POA: Diagnosis not present

## 2020-04-02 DIAGNOSIS — I69351 Hemiplegia and hemiparesis following cerebral infarction affecting right dominant side: Secondary | ICD-10-CM | POA: Diagnosis not present

## 2020-04-02 DIAGNOSIS — I69322 Dysarthria following cerebral infarction: Secondary | ICD-10-CM | POA: Diagnosis not present

## 2020-04-02 DIAGNOSIS — I700 Atherosclerosis of aorta: Secondary | ICD-10-CM | POA: Diagnosis not present

## 2020-04-02 MED ORDER — ATORVASTATIN CALCIUM 40 MG PO TABS: 40.0000 mg | ORAL_TABLET | Freq: Every day | ORAL | 1 refills | Status: DC

## 2020-04-02 NOTE — Patient Instructions (Addendum)
F/U in 4 months, call  If t you need me before  Please get fasting lipid, cmp and EGFr , HBa1C 5 days before next appointment  Hope that you have full recovery from recent stroke, work with PT/OT and keep blood pressure, blood sugar and cholesterol well controlled  Be careful not to fall  Thanks for choosing [facility], we consider it a privelige to serve you.

## 2020-04-02 NOTE — Telephone Encounter (Signed)
Life insurance form received via fax Copied Noted Sleeved

## 2020-04-03 DIAGNOSIS — I10 Essential (primary) hypertension: Secondary | ICD-10-CM | POA: Diagnosis not present

## 2020-04-03 DIAGNOSIS — I700 Atherosclerosis of aorta: Secondary | ICD-10-CM | POA: Diagnosis not present

## 2020-04-03 DIAGNOSIS — I69351 Hemiplegia and hemiparesis following cerebral infarction affecting right dominant side: Secondary | ICD-10-CM | POA: Diagnosis not present

## 2020-04-03 DIAGNOSIS — U071 COVID-19: Secondary | ICD-10-CM | POA: Diagnosis not present

## 2020-04-03 DIAGNOSIS — I69322 Dysarthria following cerebral infarction: Secondary | ICD-10-CM | POA: Diagnosis not present

## 2020-04-03 DIAGNOSIS — M47812 Spondylosis without myelopathy or radiculopathy, cervical region: Secondary | ICD-10-CM | POA: Diagnosis not present

## 2020-04-03 DIAGNOSIS — R32 Unspecified urinary incontinence: Secondary | ICD-10-CM | POA: Diagnosis not present

## 2020-04-03 DIAGNOSIS — M1990 Unspecified osteoarthritis, unspecified site: Secondary | ICD-10-CM | POA: Diagnosis not present

## 2020-04-03 DIAGNOSIS — R3915 Urgency of urination: Secondary | ICD-10-CM | POA: Diagnosis not present

## 2020-04-05 ENCOUNTER — Encounter: Payer: Self-pay | Admitting: Family Medicine

## 2020-04-05 DIAGNOSIS — Z09 Encounter for follow-up examination after completed treatment for conditions other than malignant neoplasm: Secondary | ICD-10-CM | POA: Insufficient documentation

## 2020-04-05 NOTE — Progress Notes (Signed)
Virtual Visit via Telephone Note  I connected with Amber Fernandez on 04/05/20 at  2:00 PM EST by telephone and verified that I am speaking with the correct person using two identifiers.  Location: Patient: home Provider: remote office   I discussed the limitations, risks, security and privacy concerns of performing an evaluation and management service by telephone and the availability of in person appointments. I also discussed with the patient that there may be a patient responsible charge related to this service. The patient expressed understanding and agreed to proceed.   History of Present Illness: F/u recent hospitalization at [facility] with dx of  Acute pontine CVA, from 2/11 to 03/17/2020 Reports marked upper greater than lower extremity weakness on the right, receiving Pt and OT. Has Neurology f/u appt already scheduled. Speech is improving Hospital course Is reviewed with Amber Fernandez and all questions answered. Feels as though she is improving slowly    Observations/Objective: BP (!) 142/80   Ht 4\' 11"  (1.499 m)   Wt 144 lb (65.3 kg)   BMI 29.08 kg/m  Good communication with no confusion and intact memory. Alert and oriented x 3 No signs of respiratory distress during speech    Assessment and Plan:  Hospital discharge follow-up Patient in for follow up of recent hospitalization. Discharge summary, and laboratory and radiology data are reviewed, and any questions or concerns  are discussed. Specific issues requiring follow up are specifically addressed.   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) currently undergoing PT/OT has right weakness, unable to work , being evaluated by Neurology as follow up  Depression, major, single episode, moderate (HCC) Reports not needing zoloft and has intentionally discontinued the medication  Essential hypertension DASH diet and commitment to daily physical activity for a minimum of 30 minutes discussed and encouraged,  as a part of hypertension management. The importance of attaining a healthy weight is also discussed.  BP/Weight 04/02/2020 03/23/2020 02/24/2020 02/17/2020 02/03/2020 10/03/2019 07/29/2019  Systolic BP 142 142 - 120 134 07/31/2019 116  Diastolic BP 80 80 - 81 85 80 79  Wt. (Lbs) 144 144 148 - 148 151 150.4  BMI 29.08 29.08 29.89 - 29.89 30.5 30.38     Needs in office re assesment  Prediabetes Patient educated about the importance of limiting  Carbohydrate intake , the need to commit to daily physical activity for a minimum of 30 minutes , and to commit weight loss. The fact that changes in all these areas will reduce or eliminate all together the development of diabetes is stressed.  6.4 in 03/2020 Updated lab needed at/ before next visit.   Diabetic Labs Latest Ref Rng & Units 03/23/2020 12/03/2019 05/06/2019 04/16/2019 07/06/2018  HbA1c <5.7 % of total Hgb - - 5.9(H) - 5.8(H)  Chol <200 mg/dL - - 09/05/2018 - 220  HDL > OR = 50 mg/dL - - 68 - 53  Calc LDL mg/dL (calc) - - 254) - 270(W)  Triglycerides <150 mg/dL - - 98 - 237(S  Creatinine 0.57 - 1.00 mg/dL 283 1.51 7.61) 6.07(P 0.89   BP/Weight 04/02/2020 03/23/2020 02/24/2020 02/17/2020 02/03/2020 10/03/2019 07/29/2019  Systolic BP 142 142 - 120 134 07/31/2019 116  Diastolic BP 80 80 - 81 85 80 79  Wt. (Lbs) 144 144 148 - 148 151 150.4  BMI 29.08 29.08 29.89 - 29.89 30.5 30.38   No flowsheet data found.     Follow Up Instructions:    I discussed the assessment and treatment plan with  the patient. The patient was provided an opportunity to ask questions and all were answered. The patient agreed with the plan and demonstrated an understanding of the instructions.   The patient was advised to call back or seek an in-person evaluation if the symptoms worsen or if the condition fails to improve as anticipated.  I provided 15 minutes of non-face-to-face time during this encounter.   Syliva Overman, MD

## 2020-04-05 NOTE — Assessment & Plan Note (Signed)
Patient educated about the importance of limiting  Carbohydrate intake , the need to commit to daily physical activity for a minimum of 30 minutes , and to commit weight loss. The fact that changes in all these areas will reduce or eliminate all together the development of diabetes is stressed.  6.4 in 03/2020 Updated lab needed at/ before next visit.   Diabetic Labs Latest Ref Rng & Units 03/23/2020 12/03/2019 05/06/2019 04/16/2019 07/06/2018  HbA1c <5.7 % of total Hgb - - 5.9(H) - 5.8(H)  Chol <200 mg/dL - - 834 - 196  HDL > OR = 50 mg/dL - - 68 - 53  Calc LDL mg/dL (calc) - - 222(L) - 798(X)  Triglycerides <150 mg/dL - - 98 - 211  Creatinine 0.57 - 1.00 mg/dL 9.41 7.40 8.14(G) 8.18 0.89   BP/Weight 04/02/2020 03/23/2020 02/24/2020 02/17/2020 02/03/2020 10/03/2019 07/29/2019  Systolic BP 142 142 - 120 134 563 116  Diastolic BP 80 80 - 81 85 80 79  Wt. (Lbs) 144 144 148 - 148 151 150.4  BMI 29.08 29.08 29.89 - 29.89 30.5 30.38   No flowsheet data found.

## 2020-04-05 NOTE — Assessment & Plan Note (Signed)
DASH diet and commitment to daily physical activity for a minimum of 30 minutes discussed and encouraged, as a part of hypertension management. The importance of attaining a healthy weight is also discussed.  BP/Weight 04/02/2020 03/23/2020 02/24/2020 02/17/2020 02/03/2020 10/03/2019 07/29/2019  Systolic BP 142 142 - 120 134 782 116  Diastolic BP 80 80 - 81 85 80 79  Wt. (Lbs) 144 144 148 - 148 151 150.4  BMI 29.08 29.08 29.89 - 29.89 30.5 30.38     Needs in office re assesment

## 2020-04-05 NOTE — Assessment & Plan Note (Signed)
Patient in for follow up of recent hospitalization. Discharge summary, and laboratory and radiology data are reviewed, and any questions or concerns  are discussed. Specific issues requiring follow up are specifically addressed.  

## 2020-04-05 NOTE — Assessment & Plan Note (Signed)
Reports not needing zoloft and has intentionally discontinued the medication

## 2020-04-05 NOTE — Assessment & Plan Note (Signed)
currently undergoing PT/OT has right weakness, unable to work , being evaluated by Neurology as follow up

## 2020-04-06 ENCOUNTER — Ambulatory Visit: Payer: Medicare HMO | Admitting: Diagnostic Neuroimaging

## 2020-04-06 ENCOUNTER — Encounter: Payer: Self-pay | Admitting: Diagnostic Neuroimaging

## 2020-04-06 ENCOUNTER — Other Ambulatory Visit: Payer: Self-pay

## 2020-04-06 VITALS — BP 139/84 | HR 95 | Ht 59.0 in | Wt 151.0 lb

## 2020-04-06 DIAGNOSIS — I6350 Cerebral infarction due to unspecified occlusion or stenosis of unspecified cerebral artery: Secondary | ICD-10-CM | POA: Diagnosis not present
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
## 2020-04-06 NOTE — Patient Instructions (Signed)
LEFT PONTINE STROKE - continue aspirin 81mg  daily - continue plavix 75mg  daily x 3 months, then stop - continue atorvastatin, BP control - continue PT, OT, ST

## 2020-04-07 DIAGNOSIS — I69351 Hemiplegia and hemiparesis following cerebral infarction affecting right dominant side: Secondary | ICD-10-CM | POA: Diagnosis not present

## 2020-04-07 DIAGNOSIS — M47812 Spondylosis without myelopathy or radiculopathy, cervical region: Secondary | ICD-10-CM | POA: Diagnosis not present

## 2020-04-07 DIAGNOSIS — I10 Essential (primary) hypertension: Secondary | ICD-10-CM | POA: Diagnosis not present

## 2020-04-07 DIAGNOSIS — R32 Unspecified urinary incontinence: Secondary | ICD-10-CM | POA: Diagnosis not present

## 2020-04-07 DIAGNOSIS — R3915 Urgency of urination: Secondary | ICD-10-CM | POA: Diagnosis not present

## 2020-04-07 DIAGNOSIS — M1990 Unspecified osteoarthritis, unspecified site: Secondary | ICD-10-CM | POA: Diagnosis not present

## 2020-04-07 DIAGNOSIS — U071 COVID-19: Secondary | ICD-10-CM | POA: Diagnosis not present

## 2020-04-07 DIAGNOSIS — I700 Atherosclerosis of aorta: Secondary | ICD-10-CM | POA: Diagnosis not present

## 2020-04-07 DIAGNOSIS — I69322 Dysarthria following cerebral infarction: Secondary | ICD-10-CM | POA: Diagnosis not present

## 2020-04-08 DIAGNOSIS — U071 COVID-19: Secondary | ICD-10-CM | POA: Diagnosis not present

## 2020-04-08 DIAGNOSIS — R32 Unspecified urinary incontinence: Secondary | ICD-10-CM | POA: Diagnosis not present

## 2020-04-08 DIAGNOSIS — M47812 Spondylosis without myelopathy or radiculopathy, cervical region: Secondary | ICD-10-CM | POA: Diagnosis not present

## 2020-04-08 DIAGNOSIS — I69351 Hemiplegia and hemiparesis following cerebral infarction affecting right dominant side: Secondary | ICD-10-CM | POA: Diagnosis not present

## 2020-04-08 DIAGNOSIS — M1990 Unspecified osteoarthritis, unspecified site: Secondary | ICD-10-CM | POA: Diagnosis not present

## 2020-04-08 DIAGNOSIS — I700 Atherosclerosis of aorta: Secondary | ICD-10-CM | POA: Diagnosis not present

## 2020-04-08 DIAGNOSIS — R3915 Urgency of urination: Secondary | ICD-10-CM | POA: Diagnosis not present

## 2020-04-08 DIAGNOSIS — I10 Essential (primary) hypertension: Secondary | ICD-10-CM | POA: Diagnosis not present

## 2020-04-08 DIAGNOSIS — I69322 Dysarthria following cerebral infarction: Secondary | ICD-10-CM | POA: Diagnosis not present

## 2020-04-09 DIAGNOSIS — I69351 Hemiplegia and hemiparesis following cerebral infarction affecting right dominant side: Secondary | ICD-10-CM | POA: Diagnosis not present

## 2020-04-09 DIAGNOSIS — R32 Unspecified urinary incontinence: Secondary | ICD-10-CM | POA: Diagnosis not present

## 2020-04-09 DIAGNOSIS — I69322 Dysarthria following cerebral infarction: Secondary | ICD-10-CM | POA: Diagnosis not present

## 2020-04-09 DIAGNOSIS — R3915 Urgency of urination: Secondary | ICD-10-CM | POA: Diagnosis not present

## 2020-04-09 DIAGNOSIS — M47812 Spondylosis without myelopathy or radiculopathy, cervical region: Secondary | ICD-10-CM | POA: Diagnosis not present

## 2020-04-09 DIAGNOSIS — I700 Atherosclerosis of aorta: Secondary | ICD-10-CM | POA: Diagnosis not present

## 2020-04-09 DIAGNOSIS — I10 Essential (primary) hypertension: Secondary | ICD-10-CM | POA: Diagnosis not present

## 2020-04-09 DIAGNOSIS — U071 COVID-19: Secondary | ICD-10-CM | POA: Diagnosis not present

## 2020-04-09 DIAGNOSIS — M1990 Unspecified osteoarthritis, unspecified site: Secondary | ICD-10-CM | POA: Diagnosis not present

## 2020-04-13 ENCOUNTER — Ambulatory Visit: Payer: BC Managed Care – PPO | Admitting: Family Medicine

## 2020-04-13 DIAGNOSIS — I10 Essential (primary) hypertension: Secondary | ICD-10-CM | POA: Diagnosis not present

## 2020-04-13 DIAGNOSIS — I69351 Hemiplegia and hemiparesis following cerebral infarction affecting right dominant side: Secondary | ICD-10-CM | POA: Diagnosis not present

## 2020-04-13 DIAGNOSIS — I69322 Dysarthria following cerebral infarction: Secondary | ICD-10-CM | POA: Diagnosis not present

## 2020-04-13 DIAGNOSIS — R3915 Urgency of urination: Secondary | ICD-10-CM | POA: Diagnosis not present

## 2020-04-13 DIAGNOSIS — I700 Atherosclerosis of aorta: Secondary | ICD-10-CM | POA: Diagnosis not present

## 2020-04-13 DIAGNOSIS — M47812 Spondylosis without myelopathy or radiculopathy, cervical region: Secondary | ICD-10-CM | POA: Diagnosis not present

## 2020-04-13 DIAGNOSIS — M1990 Unspecified osteoarthritis, unspecified site: Secondary | ICD-10-CM | POA: Diagnosis not present

## 2020-04-13 DIAGNOSIS — R32 Unspecified urinary incontinence: Secondary | ICD-10-CM | POA: Diagnosis not present

## 2020-04-13 DIAGNOSIS — U071 COVID-19: Secondary | ICD-10-CM | POA: Diagnosis not present

## 2020-04-14 DIAGNOSIS — M1990 Unspecified osteoarthritis, unspecified site: Secondary | ICD-10-CM | POA: Diagnosis not present

## 2020-04-14 DIAGNOSIS — M47812 Spondylosis without myelopathy or radiculopathy, cervical region: Secondary | ICD-10-CM | POA: Diagnosis not present

## 2020-04-14 DIAGNOSIS — U071 COVID-19: Secondary | ICD-10-CM | POA: Diagnosis not present

## 2020-04-14 DIAGNOSIS — R3915 Urgency of urination: Secondary | ICD-10-CM | POA: Diagnosis not present

## 2020-04-14 DIAGNOSIS — R32 Unspecified urinary incontinence: Secondary | ICD-10-CM | POA: Diagnosis not present

## 2020-04-14 DIAGNOSIS — I69351 Hemiplegia and hemiparesis following cerebral infarction affecting right dominant side: Secondary | ICD-10-CM | POA: Diagnosis not present

## 2020-04-14 DIAGNOSIS — I69322 Dysarthria following cerebral infarction: Secondary | ICD-10-CM | POA: Diagnosis not present

## 2020-04-14 DIAGNOSIS — I700 Atherosclerosis of aorta: Secondary | ICD-10-CM | POA: Diagnosis not present

## 2020-04-14 DIAGNOSIS — I10 Essential (primary) hypertension: Secondary | ICD-10-CM | POA: Diagnosis not present

## 2020-04-15 DIAGNOSIS — M47812 Spondylosis without myelopathy or radiculopathy, cervical region: Secondary | ICD-10-CM | POA: Diagnosis not present

## 2020-04-15 DIAGNOSIS — I69351 Hemiplegia and hemiparesis following cerebral infarction affecting right dominant side: Secondary | ICD-10-CM | POA: Diagnosis not present

## 2020-04-15 DIAGNOSIS — I10 Essential (primary) hypertension: Secondary | ICD-10-CM | POA: Diagnosis not present

## 2020-04-15 DIAGNOSIS — I700 Atherosclerosis of aorta: Secondary | ICD-10-CM | POA: Diagnosis not present

## 2020-04-15 DIAGNOSIS — M1990 Unspecified osteoarthritis, unspecified site: Secondary | ICD-10-CM | POA: Diagnosis not present

## 2020-04-15 DIAGNOSIS — R3915 Urgency of urination: Secondary | ICD-10-CM | POA: Diagnosis not present

## 2020-04-15 DIAGNOSIS — R32 Unspecified urinary incontinence: Secondary | ICD-10-CM | POA: Diagnosis not present

## 2020-04-15 DIAGNOSIS — U071 COVID-19: Secondary | ICD-10-CM | POA: Diagnosis not present

## 2020-04-15 DIAGNOSIS — I69322 Dysarthria following cerebral infarction: Secondary | ICD-10-CM | POA: Diagnosis not present

## 2020-04-17 DIAGNOSIS — U071 COVID-19: Secondary | ICD-10-CM | POA: Diagnosis not present

## 2020-04-17 DIAGNOSIS — I10 Essential (primary) hypertension: Secondary | ICD-10-CM | POA: Diagnosis not present

## 2020-04-17 DIAGNOSIS — I69322 Dysarthria following cerebral infarction: Secondary | ICD-10-CM | POA: Diagnosis not present

## 2020-04-17 DIAGNOSIS — M1990 Unspecified osteoarthritis, unspecified site: Secondary | ICD-10-CM | POA: Diagnosis not present

## 2020-04-17 DIAGNOSIS — R3915 Urgency of urination: Secondary | ICD-10-CM | POA: Diagnosis not present

## 2020-04-17 DIAGNOSIS — R32 Unspecified urinary incontinence: Secondary | ICD-10-CM | POA: Diagnosis not present

## 2020-04-17 DIAGNOSIS — I700 Atherosclerosis of aorta: Secondary | ICD-10-CM | POA: Diagnosis not present

## 2020-04-17 DIAGNOSIS — M47812 Spondylosis without myelopathy or radiculopathy, cervical region: Secondary | ICD-10-CM | POA: Diagnosis not present

## 2020-04-17 DIAGNOSIS — I69351 Hemiplegia and hemiparesis following cerebral infarction affecting right dominant side: Secondary | ICD-10-CM | POA: Diagnosis not present

## 2020-04-19 DIAGNOSIS — M47812 Spondylosis without myelopathy or radiculopathy, cervical region: Secondary | ICD-10-CM

## 2020-04-19 DIAGNOSIS — R3915 Urgency of urination: Secondary | ICD-10-CM | POA: Diagnosis not present

## 2020-04-19 DIAGNOSIS — R350 Frequency of micturition: Secondary | ICD-10-CM

## 2020-04-19 DIAGNOSIS — R7303 Prediabetes: Secondary | ICD-10-CM

## 2020-04-19 DIAGNOSIS — I10 Essential (primary) hypertension: Secondary | ICD-10-CM | POA: Diagnosis not present

## 2020-04-19 DIAGNOSIS — I69322 Dysarthria following cerebral infarction: Secondary | ICD-10-CM | POA: Diagnosis not present

## 2020-04-19 DIAGNOSIS — R32 Unspecified urinary incontinence: Secondary | ICD-10-CM | POA: Diagnosis not present

## 2020-04-19 DIAGNOSIS — M1990 Unspecified osteoarthritis, unspecified site: Secondary | ICD-10-CM | POA: Diagnosis not present

## 2020-04-19 DIAGNOSIS — U071 COVID-19: Secondary | ICD-10-CM | POA: Diagnosis not present

## 2020-04-19 DIAGNOSIS — I700 Atherosclerosis of aorta: Secondary | ICD-10-CM | POA: Diagnosis not present

## 2020-04-19 DIAGNOSIS — I69351 Hemiplegia and hemiparesis following cerebral infarction affecting right dominant side: Secondary | ICD-10-CM | POA: Diagnosis not present

## 2020-04-20 ENCOUNTER — Ambulatory Visit (INDEPENDENT_AMBULATORY_CARE_PROVIDER_SITE_OTHER): Payer: Medicare HMO | Admitting: Nurse Practitioner

## 2020-04-20 ENCOUNTER — Encounter: Payer: Self-pay | Admitting: Nurse Practitioner

## 2020-04-20 ENCOUNTER — Other Ambulatory Visit: Payer: Self-pay

## 2020-04-20 VITALS — BP 151/79 | HR 99 | Temp 98.0°F | Resp 18 | Ht 59.0 in | Wt 155.0 lb

## 2020-04-20 DIAGNOSIS — D509 Iron deficiency anemia, unspecified: Secondary | ICD-10-CM | POA: Diagnosis not present

## 2020-04-20 DIAGNOSIS — M1990 Unspecified osteoarthritis, unspecified site: Secondary | ICD-10-CM | POA: Diagnosis not present

## 2020-04-20 DIAGNOSIS — U071 COVID-19: Secondary | ICD-10-CM | POA: Diagnosis not present

## 2020-04-20 DIAGNOSIS — I6350 Cerebral infarction due to unspecified occlusion or stenosis of unspecified cerebral artery: Secondary | ICD-10-CM | POA: Diagnosis not present

## 2020-04-20 DIAGNOSIS — I10 Essential (primary) hypertension: Secondary | ICD-10-CM

## 2020-04-20 DIAGNOSIS — R3915 Urgency of urination: Secondary | ICD-10-CM | POA: Diagnosis not present

## 2020-04-20 DIAGNOSIS — M47812 Spondylosis without myelopathy or radiculopathy, cervical region: Secondary | ICD-10-CM | POA: Diagnosis not present

## 2020-04-20 DIAGNOSIS — I700 Atherosclerosis of aorta: Secondary | ICD-10-CM | POA: Diagnosis not present

## 2020-04-20 DIAGNOSIS — R7303 Prediabetes: Secondary | ICD-10-CM

## 2020-04-20 DIAGNOSIS — R32 Unspecified urinary incontinence: Secondary | ICD-10-CM | POA: Diagnosis not present

## 2020-04-20 DIAGNOSIS — I69322 Dysarthria following cerebral infarction: Secondary | ICD-10-CM | POA: Diagnosis not present

## 2020-04-20 DIAGNOSIS — I69351 Hemiplegia and hemiparesis following cerebral infarction affecting right dominant side: Secondary | ICD-10-CM | POA: Diagnosis not present
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
## 2020-04-20 NOTE — Assessment & Plan Note (Signed)
-  she was on plavix and ASA x 3 weeks -STOP plavix -continue ASA

## 2020-04-20 NOTE — Patient Instructions (Signed)
The plavix was stopped today. Aspirin and plavix are recommended for 3 weeks after a stroke, but you only need to continue aspirin after 3 weeks.  Studies show that there is no benefit to being on both for longer than 3 weeks after having a stroke, so that is why it was not refilled today.  We will check your blood pressure as well as labs in 2 months. Please have fasting labs drawn 2-3 days prior to your appointment so we can discuss them at your office visit.

## 2020-04-20 NOTE — Assessment & Plan Note (Addendum)
BP Readings from Last 3 Encounters:  04/20/20 (!) 151/79  04/06/20 139/84  04/02/20 (!) 142/80  -BP elevated today -she states her home readings are great -no change to meds today

## 2020-04-21 DIAGNOSIS — R32 Unspecified urinary incontinence: Secondary | ICD-10-CM | POA: Diagnosis not present

## 2020-04-21 DIAGNOSIS — R3915 Urgency of urination: Secondary | ICD-10-CM | POA: Diagnosis not present

## 2020-04-21 DIAGNOSIS — I700 Atherosclerosis of aorta: Secondary | ICD-10-CM | POA: Diagnosis not present

## 2020-04-21 DIAGNOSIS — I69351 Hemiplegia and hemiparesis following cerebral infarction affecting right dominant side: Secondary | ICD-10-CM | POA: Diagnosis not present

## 2020-04-21 DIAGNOSIS — M1990 Unspecified osteoarthritis, unspecified site: Secondary | ICD-10-CM | POA: Diagnosis not present

## 2020-04-21 DIAGNOSIS — M47812 Spondylosis without myelopathy or radiculopathy, cervical region: Secondary | ICD-10-CM | POA: Diagnosis not present

## 2020-04-21 DIAGNOSIS — U071 COVID-19: Secondary | ICD-10-CM | POA: Diagnosis not present

## 2020-04-21 DIAGNOSIS — I10 Essential (primary) hypertension: Secondary | ICD-10-CM | POA: Diagnosis not present

## 2020-04-21 DIAGNOSIS — I69322 Dysarthria following cerebral infarction: Secondary | ICD-10-CM | POA: Diagnosis not present

## 2020-04-22 DIAGNOSIS — R3915 Urgency of urination: Secondary | ICD-10-CM | POA: Diagnosis not present

## 2020-04-22 DIAGNOSIS — R32 Unspecified urinary incontinence: Secondary | ICD-10-CM | POA: Diagnosis not present

## 2020-04-22 DIAGNOSIS — I69322 Dysarthria following cerebral infarction: Secondary | ICD-10-CM | POA: Diagnosis not present

## 2020-04-22 DIAGNOSIS — M47812 Spondylosis without myelopathy or radiculopathy, cervical region: Secondary | ICD-10-CM | POA: Diagnosis not present

## 2020-04-22 DIAGNOSIS — I10 Essential (primary) hypertension: Secondary | ICD-10-CM | POA: Diagnosis not present

## 2020-04-22 DIAGNOSIS — U071 COVID-19: Secondary | ICD-10-CM | POA: Diagnosis not present

## 2020-04-22 DIAGNOSIS — I700 Atherosclerosis of aorta: Secondary | ICD-10-CM | POA: Diagnosis not present

## 2020-04-22 DIAGNOSIS — M1990 Unspecified osteoarthritis, unspecified site: Secondary | ICD-10-CM | POA: Diagnosis not present

## 2020-04-22 DIAGNOSIS — I69351 Hemiplegia and hemiparesis following cerebral infarction affecting right dominant side: Secondary | ICD-10-CM | POA: Diagnosis not present

## 2020-04-23 DIAGNOSIS — Z8489 Family history of other specified conditions: Secondary | ICD-10-CM

## 2020-04-23 NOTE — Telephone Encounter (Signed)
Complete

## 2020-04-24 ENCOUNTER — Telehealth: Payer: Self-pay

## 2020-04-24 NOTE — Telephone Encounter (Signed)
Disability Forms Noted Copied Sleeved

## 2020-04-27 DIAGNOSIS — R32 Unspecified urinary incontinence: Secondary | ICD-10-CM | POA: Diagnosis not present

## 2020-04-27 DIAGNOSIS — U071 COVID-19: Secondary | ICD-10-CM | POA: Diagnosis not present

## 2020-04-27 DIAGNOSIS — I69322 Dysarthria following cerebral infarction: Secondary | ICD-10-CM | POA: Diagnosis not present

## 2020-04-27 DIAGNOSIS — M1990 Unspecified osteoarthritis, unspecified site: Secondary | ICD-10-CM | POA: Diagnosis not present

## 2020-04-27 DIAGNOSIS — R3915 Urgency of urination: Secondary | ICD-10-CM | POA: Diagnosis not present

## 2020-04-27 DIAGNOSIS — I700 Atherosclerosis of aorta: Secondary | ICD-10-CM | POA: Diagnosis not present

## 2020-04-27 DIAGNOSIS — I10 Essential (primary) hypertension: Secondary | ICD-10-CM | POA: Diagnosis not present

## 2020-04-27 DIAGNOSIS — I69351 Hemiplegia and hemiparesis following cerebral infarction affecting right dominant side: Secondary | ICD-10-CM | POA: Diagnosis not present

## 2020-04-27 DIAGNOSIS — M47812 Spondylosis without myelopathy or radiculopathy, cervical region: Secondary | ICD-10-CM | POA: Diagnosis not present

## 2020-04-28 DIAGNOSIS — U071 COVID-19: Secondary | ICD-10-CM | POA: Diagnosis not present

## 2020-04-28 DIAGNOSIS — I69351 Hemiplegia and hemiparesis following cerebral infarction affecting right dominant side: Secondary | ICD-10-CM | POA: Diagnosis not present

## 2020-04-28 DIAGNOSIS — R32 Unspecified urinary incontinence: Secondary | ICD-10-CM | POA: Diagnosis not present

## 2020-04-28 DIAGNOSIS — I700 Atherosclerosis of aorta: Secondary | ICD-10-CM | POA: Diagnosis not present

## 2020-04-28 DIAGNOSIS — I69322 Dysarthria following cerebral infarction: Secondary | ICD-10-CM | POA: Diagnosis not present

## 2020-04-28 DIAGNOSIS — R3915 Urgency of urination: Secondary | ICD-10-CM | POA: Diagnosis not present

## 2020-04-28 DIAGNOSIS — M1990 Unspecified osteoarthritis, unspecified site: Secondary | ICD-10-CM | POA: Diagnosis not present

## 2020-04-28 DIAGNOSIS — I10 Essential (primary) hypertension: Secondary | ICD-10-CM | POA: Diagnosis not present

## 2020-04-28 DIAGNOSIS — M47812 Spondylosis without myelopathy or radiculopathy, cervical region: Secondary | ICD-10-CM | POA: Diagnosis not present

## 2020-05-04 DIAGNOSIS — Z8489 Family history of other specified conditions: Secondary | ICD-10-CM

## 2020-05-05 ENCOUNTER — Telehealth: Payer: Self-pay

## 2020-05-05 ENCOUNTER — Other Ambulatory Visit: Payer: Self-pay

## 2020-05-05 DIAGNOSIS — M47812 Spondylosis without myelopathy or radiculopathy, cervical region: Secondary | ICD-10-CM | POA: Diagnosis not present

## 2020-05-05 DIAGNOSIS — I69322 Dysarthria following cerebral infarction: Secondary | ICD-10-CM | POA: Diagnosis not present

## 2020-05-05 DIAGNOSIS — I700 Atherosclerosis of aorta: Secondary | ICD-10-CM | POA: Diagnosis not present

## 2020-05-05 DIAGNOSIS — R32 Unspecified urinary incontinence: Secondary | ICD-10-CM | POA: Diagnosis not present

## 2020-05-05 DIAGNOSIS — M1990 Unspecified osteoarthritis, unspecified site: Secondary | ICD-10-CM | POA: Diagnosis not present

## 2020-05-05 DIAGNOSIS — R3915 Urgency of urination: Secondary | ICD-10-CM | POA: Diagnosis not present

## 2020-05-05 DIAGNOSIS — I10 Essential (primary) hypertension: Secondary | ICD-10-CM | POA: Diagnosis not present

## 2020-05-05 DIAGNOSIS — U071 COVID-19: Secondary | ICD-10-CM | POA: Diagnosis not present

## 2020-05-05 DIAGNOSIS — I69351 Hemiplegia and hemiparesis following cerebral infarction affecting right dominant side: Secondary | ICD-10-CM | POA: Diagnosis not present

## 2020-05-05 MED ORDER — AMLODIPINE BESYLATE 5 MG PO TABS: 1.0000 | ORAL_TABLET | Freq: Every day | ORAL | 0 refills | Status: DC

## 2020-05-05 NOTE — Telephone Encounter (Signed)
complete

## 2020-05-05 NOTE — Telephone Encounter (Signed)
STD forms Noted Copied Sleeved

## 2020-05-06 ENCOUNTER — Other Ambulatory Visit: Payer: Self-pay

## 2020-05-06 MED ORDER — ATORVASTATIN CALCIUM 40 MG PO TABS: 40.0000 mg | ORAL_TABLET | Freq: Every day | ORAL | 1 refills | Status: DC

## 2020-05-06 MED ORDER — AMLODIPINE BESYLATE 5 MG PO TABS: 1.0000 | ORAL_TABLET | Freq: Every day | ORAL | 1 refills | Status: DC

## 2020-05-07 ENCOUNTER — Telehealth: Payer: Self-pay

## 2020-05-07 DIAGNOSIS — Z0279 Encounter for issue of other medical certificate: Secondary | ICD-10-CM

## 2020-05-07 NOTE — Telephone Encounter (Signed)
The FMLA forms that were completed yesterday the patient states she needs a note for work stating the dates she started getting sick up until now to put with them ph# [phone]

## 2020-05-07 NOTE — Telephone Encounter (Signed)
complete

## 2020-05-08 ENCOUNTER — Other Ambulatory Visit: Payer: Self-pay | Admitting: Family Medicine

## 2020-05-11 NOTE — Telephone Encounter (Signed)
Letter provided

## 2020-05-12 DIAGNOSIS — U071 COVID-19: Secondary | ICD-10-CM | POA: Diagnosis not present

## 2020-05-12 DIAGNOSIS — M47812 Spondylosis without myelopathy or radiculopathy, cervical region: Secondary | ICD-10-CM | POA: Diagnosis not present

## 2020-05-12 DIAGNOSIS — R32 Unspecified urinary incontinence: Secondary | ICD-10-CM | POA: Diagnosis not present

## 2020-05-12 DIAGNOSIS — M1990 Unspecified osteoarthritis, unspecified site: Secondary | ICD-10-CM | POA: Diagnosis not present

## 2020-05-12 DIAGNOSIS — R3915 Urgency of urination: Secondary | ICD-10-CM | POA: Diagnosis not present

## 2020-05-12 DIAGNOSIS — I700 Atherosclerosis of aorta: Secondary | ICD-10-CM | POA: Diagnosis not present

## 2020-05-12 DIAGNOSIS — I69322 Dysarthria following cerebral infarction: Secondary | ICD-10-CM | POA: Diagnosis not present

## 2020-05-12 DIAGNOSIS — I10 Essential (primary) hypertension: Secondary | ICD-10-CM | POA: Diagnosis not present

## 2020-05-12 DIAGNOSIS — I69351 Hemiplegia and hemiparesis following cerebral infarction affecting right dominant side: Secondary | ICD-10-CM | POA: Diagnosis not present

## 2020-05-13 DIAGNOSIS — I69351 Hemiplegia and hemiparesis following cerebral infarction affecting right dominant side: Secondary | ICD-10-CM | POA: Diagnosis not present

## 2020-05-13 DIAGNOSIS — I10 Essential (primary) hypertension: Secondary | ICD-10-CM | POA: Diagnosis not present

## 2020-05-13 DIAGNOSIS — I69322 Dysarthria following cerebral infarction: Secondary | ICD-10-CM | POA: Diagnosis not present

## 2020-05-13 DIAGNOSIS — M1990 Unspecified osteoarthritis, unspecified site: Secondary | ICD-10-CM | POA: Diagnosis not present

## 2020-05-13 DIAGNOSIS — I700 Atherosclerosis of aorta: Secondary | ICD-10-CM | POA: Diagnosis not present

## 2020-05-13 DIAGNOSIS — M47812 Spondylosis without myelopathy or radiculopathy, cervical region: Secondary | ICD-10-CM | POA: Diagnosis not present

## 2020-05-13 DIAGNOSIS — R3915 Urgency of urination: Secondary | ICD-10-CM | POA: Diagnosis not present

## 2020-05-13 DIAGNOSIS — R32 Unspecified urinary incontinence: Secondary | ICD-10-CM | POA: Diagnosis not present

## 2020-05-13 DIAGNOSIS — U071 COVID-19: Secondary | ICD-10-CM | POA: Diagnosis not present

## 2020-05-19 DIAGNOSIS — R32 Unspecified urinary incontinence: Secondary | ICD-10-CM | POA: Diagnosis not present

## 2020-05-19 DIAGNOSIS — M1990 Unspecified osteoarthritis, unspecified site: Secondary | ICD-10-CM | POA: Diagnosis not present

## 2020-05-19 DIAGNOSIS — R3915 Urgency of urination: Secondary | ICD-10-CM | POA: Diagnosis not present

## 2020-05-19 DIAGNOSIS — U071 COVID-19: Secondary | ICD-10-CM | POA: Diagnosis not present

## 2020-05-19 DIAGNOSIS — M47812 Spondylosis without myelopathy or radiculopathy, cervical region: Secondary | ICD-10-CM | POA: Diagnosis not present

## 2020-05-19 DIAGNOSIS — I69351 Hemiplegia and hemiparesis following cerebral infarction affecting right dominant side: Secondary | ICD-10-CM | POA: Diagnosis not present

## 2020-05-19 DIAGNOSIS — I700 Atherosclerosis of aorta: Secondary | ICD-10-CM | POA: Diagnosis not present

## 2020-05-19 DIAGNOSIS — I69322 Dysarthria following cerebral infarction: Secondary | ICD-10-CM | POA: Diagnosis not present

## 2020-05-19 DIAGNOSIS — I10 Essential (primary) hypertension: Secondary | ICD-10-CM | POA: Diagnosis not present

## 2020-05-20 DIAGNOSIS — I10 Essential (primary) hypertension: Secondary | ICD-10-CM | POA: Diagnosis not present

## 2020-05-20 DIAGNOSIS — R3915 Urgency of urination: Secondary | ICD-10-CM | POA: Diagnosis not present

## 2020-05-20 DIAGNOSIS — U071 COVID-19: Secondary | ICD-10-CM | POA: Diagnosis not present

## 2020-05-20 DIAGNOSIS — I69351 Hemiplegia and hemiparesis following cerebral infarction affecting right dominant side: Secondary | ICD-10-CM | POA: Diagnosis not present

## 2020-05-20 DIAGNOSIS — M47812 Spondylosis without myelopathy or radiculopathy, cervical region: Secondary | ICD-10-CM | POA: Diagnosis not present

## 2020-05-20 DIAGNOSIS — I700 Atherosclerosis of aorta: Secondary | ICD-10-CM | POA: Diagnosis not present

## 2020-05-20 DIAGNOSIS — R32 Unspecified urinary incontinence: Secondary | ICD-10-CM | POA: Diagnosis not present

## 2020-05-20 DIAGNOSIS — I69322 Dysarthria following cerebral infarction: Secondary | ICD-10-CM | POA: Diagnosis not present

## 2020-05-20 DIAGNOSIS — M1990 Unspecified osteoarthritis, unspecified site: Secondary | ICD-10-CM | POA: Diagnosis not present

## 2020-05-26 DIAGNOSIS — H40033 Anatomical narrow angle, bilateral: Secondary | ICD-10-CM | POA: Diagnosis not present

## 2020-05-26 DIAGNOSIS — H2513 Age-related nuclear cataract, bilateral: Secondary | ICD-10-CM | POA: Diagnosis not present

## 2020-06-04 ENCOUNTER — Telehealth: Payer: Self-pay

## 2020-06-04 ENCOUNTER — Other Ambulatory Visit: Payer: Self-pay

## 2020-06-04 DIAGNOSIS — R7303 Prediabetes: Secondary | ICD-10-CM | POA: Diagnosis not present

## 2020-06-04 DIAGNOSIS — R29898 Other symptoms and signs involving the musculoskeletal system: Secondary | ICD-10-CM

## 2020-06-04 DIAGNOSIS — D509 Iron deficiency anemia, unspecified: Secondary | ICD-10-CM | POA: Diagnosis not present

## 2020-06-04 DIAGNOSIS — I6350 Cerebral infarction due to unspecified occlusion or stenosis of unspecified cerebral artery: Secondary | ICD-10-CM | POA: Diagnosis not present

## 2020-06-04 DIAGNOSIS — I10 Essential (primary) hypertension: Secondary | ICD-10-CM | POA: Diagnosis not present

## 2020-06-04 MED ORDER — VITAMIN D (ERGOCALCIFEROL) 1.25 MG (50000 UNIT) PO CAPS: ORAL_CAPSULE | ORAL | 5 refills | Status: DC

## 2020-06-04 NOTE — Telephone Encounter (Signed)
I don't see a referral for PT, did someone else refer her or is she asking for a referral?

## 2020-06-04 NOTE — Telephone Encounter (Signed)
Pt is calling regarding Physical therapy, [facility]  [phone], is telling her she has to set everything up --  Please help --call the pt

## 2020-06-04 NOTE — Telephone Encounter (Signed)
[facility] was coming out to do Home Therapy [facility], [facility], & PT   So they completed the HOME THERAPY, and said she needed to do Therapy at a facility, doesn't care where it is ... She wants someone good to help with her Right ARM.

## 2020-06-04 NOTE — Telephone Encounter (Signed)
She had a stroke earlier this year and will benefit from PT, pls refer twice weekly for 6 weeks, thanks ?? Please ask I thought hat was done from her hospital stay at [facility] [place] , [place]

## 2020-06-04 NOTE — Telephone Encounter (Signed)
Did you refer this patient for physical therapy?

## 2020-06-04 NOTE — Addendum Note (Signed)
Addended by: Abner Greenspan on: 06/04/2020 01:55 PM   Modules accepted: Orders

## 2020-06-04 NOTE — Telephone Encounter (Signed)
PT referral sent to [facility] in [employer] [place]

## 2020-06-04 NOTE — Telephone Encounter (Signed)
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

## 2020-06-05 LAB — HEMOGLOBIN A1C
Est. average glucose Bld gHb Est-mCnc: 123 mg/dL
Hgb A1c MFr Bld: 5.9 % — ABNORMAL HIGH (ref 4.8–5.6)

## 2020-06-05 NOTE — Progress Notes (Signed)
A1c is at goal at 5.9. Labs look great. It looks like she sees you on 5/23.

## 2020-06-06 LAB — CBC WITH DIFFERENTIAL/PLATELET
Basophils Absolute: 0 10*3/uL (ref 0.0–0.2)
Basos: 1 %
EOS (ABSOLUTE): 0.3 10*3/uL (ref 0.0–0.4)
Eos: 8 %
Hematocrit: 39.3 % (ref 34.0–46.6)
Hemoglobin: 13 g/dL (ref 11.1–15.9)
Immature Grans (Abs): 0 10*3/uL (ref 0.0–0.1)
Immature Granulocytes: 0 %
Lymphocytes Absolute: 1.9 10*3/uL (ref 0.7–3.1)
Lymphs: 47 %
MCH: 26.6 pg (ref 26.6–33.0)
MCHC: 33.1 g/dL (ref 31.5–35.7)
MCV: 80 fL (ref 79–97)
Monocytes Absolute: 0.3 10*3/uL (ref 0.1–0.9)
Monocytes: 8 %
Neutrophils Absolute: 1.5 10*3/uL (ref 1.4–7.0)
Neutrophils: 36 %
Platelets: 220 10*3/uL (ref 150–450)
RBC: 4.89 x10E6/uL (ref 3.77–5.28)
RDW: 13.5 % (ref 11.7–15.4)
WBC: 4.1 10*3/uL (ref 3.4–10.8)

## 2020-06-06 LAB — CMP14+EGFR
ALT: 15 IU/L (ref 0–32)
AST: 21 IU/L (ref 0–40)
Albumin/Globulin Ratio: 1.6 (ref 1.2–2.2)
Albumin: 4.6 g/dL (ref 3.7–4.7)
Alkaline Phosphatase: 84 IU/L (ref 44–121)
BUN/Creatinine Ratio: 13 (ref 12–28)
BUN: 10 mg/dL (ref 8–27)
Bilirubin Total: 0.5 mg/dL (ref 0.0–1.2)
CO2: 21 mmol/L (ref 20–29)
Calcium: 9.8 mg/dL (ref 8.7–10.3)
Chloride: 105 mmol/L (ref 96–106)
Creatinine, Ser: 0.76 mg/dL (ref 0.57–1.00)
Globulin, Total: 2.8 g/dL (ref 1.5–4.5)
Glucose: 91 mg/dL (ref 65–99)
Potassium: 4.3 mmol/L (ref 3.5–5.2)
Sodium: 143 mmol/L (ref 134–144)
Total Protein: 7.4 g/dL (ref 6.0–8.5)
eGFR: 83 mL/min/{1.73_m2} (ref >59)

## 2020-06-06 LAB — LIPID PANEL WITH LDL/HDL RATIO
Cholesterol, Total: 145 mg/dL (ref 100–199)
HDL: 85 mg/dL (ref >39)
LDL Chol Calc (NIH): 48 mg/dL (ref 0–99)
LDL/HDL Ratio: 0.6 ratio (ref 0.0–3.2)
Triglycerides: 58 mg/dL (ref 0–149)
VLDL Cholesterol Cal: 12 mg/dL (ref 5–40)

## 2020-06-06 LAB — IRON,TIBC AND FERRITIN PANEL
Ferritin: 81 ng/mL (ref 15–150)
Iron Saturation: 22 % (ref 15–55)
Iron: 75 ug/dL (ref 27–139)
Total Iron Binding Capacity: 341 ug/dL (ref 250–450)
UIBC: 266 ug/dL (ref 118–369)

## 2020-06-08 ENCOUNTER — Other Ambulatory Visit: Payer: Self-pay

## 2020-06-08 MED ORDER — VITAMIN D (ERGOCALCIFEROL) 1.25 MG (50000 UNIT) PO CAPS: ORAL_CAPSULE | ORAL | 5 refills | Status: DC

## 2020-06-08 NOTE — Progress Notes (Signed)
Labs look great! We will see her on the 23rd.
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
## 2020-06-22 ENCOUNTER — Encounter: Payer: Self-pay | Admitting: Family Medicine

## 2020-06-22 ENCOUNTER — Other Ambulatory Visit: Payer: Self-pay

## 2020-06-22 ENCOUNTER — Ambulatory Visit (INDEPENDENT_AMBULATORY_CARE_PROVIDER_SITE_OTHER): Payer: Medicare HMO

## 2020-06-22 ENCOUNTER — Ambulatory Visit (INDEPENDENT_AMBULATORY_CARE_PROVIDER_SITE_OTHER): Payer: Medicare HMO | Admitting: Family Medicine

## 2020-06-22 VITALS — BP 132/70 | HR 89 | Temp 98.2°F | Ht 59.0 in | Wt 161.8 lb

## 2020-06-22 DIAGNOSIS — E663 Overweight: Secondary | ICD-10-CM | POA: Diagnosis not present

## 2020-06-22 DIAGNOSIS — Z0001 Encounter for general adult medical examination with abnormal findings: Secondary | ICD-10-CM | POA: Diagnosis not present

## 2020-06-22 DIAGNOSIS — I6350 Cerebral infarction due to unspecified occlusion or stenosis of unspecified cerebral artery: Secondary | ICD-10-CM

## 2020-06-22 DIAGNOSIS — Z0000 Encounter for general adult medical examination without abnormal findings: Secondary | ICD-10-CM

## 2020-06-22 DIAGNOSIS — I10 Essential (primary) hypertension: Secondary | ICD-10-CM | POA: Diagnosis not present

## 2020-06-22 DIAGNOSIS — E785 Hyperlipidemia, unspecified: Secondary | ICD-10-CM

## 2020-06-22 DIAGNOSIS — R7303 Prediabetes: Secondary | ICD-10-CM | POA: Diagnosis not present

## 2020-06-22 NOTE — Progress Notes (Signed)
Amber Fernandez     MRN: [id number]      DOB: 1945/08/31   HPI Amber Fernandez is here for follow up and re-evaluation of chronic medical conditions, medication management and review of any available recent lab and radiology data.  Preventive health is updated, specifically  Cancer screening and Immunization.   Questions or concerns regarding consultations or procedures which the PT has had in the interim are  addressed. The PT denies any adverse reactions to current medications since the last visit.  Has not worked since Feb 10, unable to work currently, very weak in RUE  With limitation in mobility, also RLE has residual weakness , negatively affecting her ability to safely ambulate and she drags her foot, needs additional physical therapy, and is still unable to work ROS Denies recent fever or chills. Reports good appetite and no disturbance in bowel movements Denies sinus pressure, nasal congestion, ear pain or sore throat. Denies chest congestion, productive cough or wheezing. Denies chest pains, palpitations and leg swelling Denies abdominal pain, nausea, vomiting,diarrhea or constipation.   Denies dysuria, frequency, hesitancy or incontinence.  Denies depression, anxiety or insomnia. Denies skin break down or rash.   PE  BP 132/70 (BP Location: Left Arm, Patient Position: Sitting, Cuff Size: Normal)   Pulse 89   Temp 98.2 F (36.8 C) (Oral)   Ht 4\' 11"  (1.499 m)   Wt 161 lb 12 oz (73.4 kg)   SpO2 96%   BMI 32.67 kg/m   Patient alert and oriented and in no cardiopulmonary distress.  HEENT: No facial asymmetry, EOMI,     Neck supple .  Chest: Clear to auscultation bilaterally.  CVS: S1, S2 no murmurs, no S3.Regular rate.  ABD: Soft non tender.   Ext: No edema  MS: decreased  ROM right , shoulder, adequate in hips and knees.  Skin: Intact, no ulcerations or rash noted.  Psych: Good eye contact, normal affect. Memory intact not anxious or depressed appearing.  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 The importance of attaining a healthy weight is also discussed.  BP/Weight 06/22/2020 06/22/2020 04/20/2020 04/06/2020 04/02/2020 03/23/2020 02/24/2020  Systolic BP 132 132 151 139 142 142 -  Diastolic BP 70 70 79 84 80 80 -  Wt. (Lbs) 161.75 161.75 155 151 144 144 148  BMI 32.67 32.67 31.31 30.5 29.08 29.08 29.89       Overweight (BMI 25.0-29.9)  Patient re-educated about  the importance of commitment to a  minimum of 150 minutes of exercise per week as able.  The importance of healthy food choices with portion control discussed, as well as eating regularly and within a 12 hour window most days. The need to choose "clean , green" food 50 to 75% of the time is discussed, as well as to make water the primary drink and set a goal of 64 ounces water daily.    Weight /BMI 06/22/2020 06/22/2020 04/20/2020  WEIGHT 161 lb 12 oz 161 lb 12 oz 155 lb  HEIGHT 4\' 11"  4\' 11"  4\' 11"   BMI 32.67 kg/m2  32.67 kg/m2 31.31 kg/m2      Dyslipidemia, goal LDL below 70 Hyperlipidemia:Low fat diet discussed and encouraged.   Lipid Panel  Lab Results  Component Value Date   CHOL 145 06/04/2020   HDL 85 06/04/2020   LDLCALC 48 06/04/2020   TRIG 58 06/04/2020   CHOLHDL 2.9 05/06/2019    Controlled, no change in medication    Prediabetes Patient educated about the importance of limiting  Carbohydrate intake , the need to commit to daily physical activity for a minimum of 30 minutes , and to commit weight loss. The fact that changes in all these areas  will reduce or eliminate all together the development of diabetes is stressed.   Diabetic Labs Latest Ref Rng & Units 06/04/2020 03/23/2020 12/03/2019 05/06/2019 04/16/2019  HbA1c 4.8 - 5.6 % 5.9(H) - - 5.9(H) -  Chol 100 - 199 mg/dL 343 - - 568 -  HDL >61 mg/dL 85 - - 68 -  Calc LDL 0 - 99 mg/dL 48 - - 683(F) -  Triglycerides 0 - 149 mg/dL 58 - - 98 -  Creatinine 0.57 - 1.00 mg/dL 2.90 2.11 1.55 2.08(Y) 0.80   BP/Weight 06/22/2020 06/22/2020 04/20/2020 04/06/2020 04/02/2020 03/23/2020 02/24/2020  Systolic BP 132 132 151 139 142 142 -  Diastolic BP 70 70 79 84 80 80 -  Wt. (Lbs) 161.75 161.75 155 151 144 144 148  BMI 32.67 32.67 31.31 30.5 29.08 29.08 29.89   No flowsheet data found.

## 2020-06-22 NOTE — Assessment & Plan Note (Signed)
Persistent RUE weakness, grade 3, has had no pT/OT x 1 month, refer to [place] twice weekly x 6 weeks

## 2020-06-22 NOTE — Patient Instructions (Signed)
F/U in 2 months, call if you need me before  You are referred to outpatient PT  And OT twice weekly [place] for the next 8 weeks, due to right upper and lower weakness with abnormal gait  Work excuse from Feb 10 until re evaluated in 2 month  Please use cane for safety and be careful not to fall

## 2020-06-22 NOTE — Patient Instructions (Signed)
Amber Fernandez , Thank you for taking time to come for your Medicare Wellness Visit. I appreciate your ongoing commitment to your health goals. Please review the following plan we discussed and let me know if I can assist you in the future.   Screening recommendations/referrals: Colonoscopy: Up to date, next due 07/03/2023 Mammogram: Up to date, next due 12/05/2020 Bone Density: Up to date, next due 01/18/2024 Recommended yearly ophthalmology/optometry visit for glaucoma screening and checkup Recommended yearly dental visit for hygiene and checkup  Vaccinations: Influenza vaccine: Up to date, next due fall 2022  Pneumococcal vaccine: Completed series  Tdap vaccine: Up to date, next due 04/28/2027 Shingles vaccine: Completed series    Advanced directives: Advance directive discussed with you today. Even though you declined this today please call our office should you change your mind and we can give you the proper paperwork for you to fill out.   Conditions/risks identified: None   Next appointment: 07/05/21 @ 1:00 PM with Nurse Health Advisor    Preventive Care 73 Years and Older, Female Preventive care refers to lifestyle choices and visits with your health care provider that can promote health and wellness. What does preventive care include?  A yearly physical exam. This is also called an annual well check.  Dental exams once or twice a year.  Routine eye exams. Ask your health care provider how often you should have your eyes checked.  Personal lifestyle choices, including:  Daily care of your teeth and gums.  Regular physical activity.  Eating a healthy diet.  Avoiding tobacco and drug use.  Limiting alcohol use.  Practicing safe sex.  Taking low-dose aspirin every day.  Taking vitamin and mineral supplements as recommended by your health care provider. What happens during an annual well check? The services and screenings done by your health care provider during  your annual well check will depend on your age, overall health, lifestyle risk factors, and family history of disease. Counseling  Your health care provider may ask you questions about your:  Alcohol use.  Tobacco use.  Drug use.  Emotional well-being.  Home and relationship well-being.  Sexual activity.  Eating habits.  History of falls.  Memory and ability to understand (cognition).  Work and work Astronomer.  Reproductive health. Screening  You may have the following tests or measurements:  Height, weight, and BMI.  Blood pressure.  Lipid and cholesterol levels. These may be checked every 5 years, or more frequently if you are over 73 years old.  Skin check.  Lung cancer screening. You may have this screening every year starting at age 73 if you have a 30-pack-year history of smoking and currently smoke or have quit within the past 15 years.  Fecal occult blood test (FOBT) of the stool. You may have this test every year starting at age 73.  Flexible sigmoidoscopy or colonoscopy. You may have a sigmoidoscopy every 5 years or a colonoscopy every 10 years starting at age 73.  Hepatitis C blood test.  Hepatitis B blood test.  Sexually transmitted disease (STD) testing.  Diabetes screening. This is done by checking your blood sugar (glucose) after you have not eaten for a while (fasting). You may have this done every 1-3 years.  Bone density scan. This is done to screen for osteoporosis. You may have this done starting at age 73.  Mammogram. This may be done every 1-2 years. Talk to your health care provider about how often you should have regular mammograms. Talk with  your health care provider about your test results, treatment options, and if necessary, the need for more tests. Vaccines  Your health care provider may recommend certain vaccines, such as:  Influenza vaccine. This is recommended every year.  Tetanus, diphtheria, and acellular pertussis (Tdap,  Td) vaccine. You may need a Td booster every 10 years.  Zoster vaccine. You may need this after age 73.  Pneumococcal 13-valent conjugate (PCV13) vaccine. One dose is recommended after age 73.  Pneumococcal polysaccharide (PPSV23) vaccine. One dose is recommended after age 73. Talk to your health care provider about which screenings and vaccines you need and how often you need them. This information is not intended to replace advice given to you by your health care provider. Make sure you discuss any questions you have with your health care provider. Document Released: 02/13/2015 Document Revised: 10/07/2015 Document Reviewed: 11/18/2014 Elsevier Interactive Patient Education  2017 [place] Prevention in the Home Falls can cause injuries. They can happen to people of all ages. There are many things you can do to make your home safe and to help prevent falls. What can I do on the outside of my home?  Regularly fix the edges of walkways and driveways and fix any cracks.  Remove anything that might make you trip as you walk through a door, such as a raised step or threshold.  Trim any bushes or trees on the path to your home.  Use bright outdoor lighting.  Clear any walking paths of anything that might make someone trip, such as rocks or tools.  Regularly check to see if handrails are loose or broken. Make sure that both sides of any steps have handrails.  Any raised decks and porches should have guardrails on the edges.  Have any leaves, snow, or ice cleared regularly.  Use sand or salt on walking paths during winter.  Clean up any spills in your garage right away. This includes oil or grease spills. What can I do in the bathroom?  Use night lights.  Install grab bars by the toilet and in the tub and shower. Do not use towel bars as grab bars.  Use non-skid mats or decals in the tub or shower.  If you need to sit down in the shower, use a plastic, non-slip  stool.  Keep the floor dry. Clean up any water that spills on the floor as soon as it happens.  Remove soap buildup in the tub or shower regularly.  Attach bath mats securely with double-sided non-slip rug tape.  Do not have throw rugs and other things on the floor that can make you trip. What can I do in the bedroom?  Use night lights.  Make sure that you have a light by your bed that is easy to reach.  Do not use any sheets or blankets that are too big for your bed. They should not hang down onto the floor.  Have a firm chair that has side arms. You can use this for support while you get dressed.  Do not have throw rugs and other things on the floor that can make you trip. What can I do in the kitchen?  Clean up any spills right away.  Avoid walking on wet floors.  Keep items that you use a lot in easy-to-reach places.  If you need to reach something above you, use a strong step stool that has a grab bar.  Keep electrical cords out of the way.  Do not  use floor polish or wax that makes floors slippery. If you must use wax, use non-skid floor wax.  Do not have throw rugs and other things on the floor that can make you trip. What can I do with my stairs?  Do not leave any items on the stairs.  Make sure that there are handrails on both sides of the stairs and use them. Fix handrails that are broken or loose. Make sure that handrails are as long as the stairways.  Check any carpeting to make sure that it is firmly attached to the stairs. Fix any carpet that is loose or worn.  Avoid having throw rugs at the top or bottom of the stairs. If you do have throw rugs, attach them to the floor with carpet tape.  Make sure that you have a light switch at the top of the stairs and the bottom of the stairs. If you do not have them, ask someone to add them for you. What else can I do to help prevent falls?  Wear shoes that:  Do not have high heels.  Have rubber bottoms.  Are  comfortable and fit you well.  Are closed at the toe. Do not wear sandals.  If you use a stepladder:  Make sure that it is fully opened. Do not climb a closed stepladder.  Make sure that both sides of the stepladder are locked into place.  Ask someone to hold it for you, if possible.  Clearly mark and make sure that you can see:  Any grab bars or handrails.  First and last steps.  Where the edge of each step is.  Use tools that help you move around (mobility aids) if they are needed. These include:  Canes.  Walkers.  Scooters.  Crutches.  Turn on the lights when you go into a dark area. Replace any light bulbs as soon as they burn out.  Set up your furniture so you have a clear path. Avoid moving your furniture around.  If any of your floors are uneven, fix them.  If there are any pets around you, be aware of where they are.  Review your medicines with your doctor. Some medicines can make you feel dizzy. This can increase your chance of falling. Ask your doctor what other things that you can do to help prevent falls. This information is not intended to replace advice given to you by your health care provider. Make sure you discuss any questions you have with your health care provider. Document Released: 11/13/2008 Document Revised: 06/25/2015 Document Reviewed: 02/21/2014 Elsevier Interactive Patient Education  2017 [employer].

## 2020-06-23 ENCOUNTER — Encounter: Payer: Self-pay | Admitting: Family Medicine

## 2020-06-23 ENCOUNTER — Ambulatory Visit: Payer: Medicare HMO | Attending: Family Medicine

## 2020-06-23 ENCOUNTER — Other Ambulatory Visit: Payer: Self-pay

## 2020-06-23 DIAGNOSIS — I639 Cerebral infarction, unspecified: Secondary | ICD-10-CM | POA: Insufficient documentation

## 2020-06-23 DIAGNOSIS — M6281 Muscle weakness (generalized): Secondary | ICD-10-CM

## 2020-06-23 DIAGNOSIS — R262 Difficulty in walking, not elsewhere classified: Secondary | ICD-10-CM | POA: Insufficient documentation

## 2020-06-23 DIAGNOSIS — E785 Hyperlipidemia, unspecified: Secondary | ICD-10-CM | POA: Insufficient documentation

## 2020-06-23 NOTE — Assessment & Plan Note (Signed)
Hyperlipidemia:Low fat diet discussed and encouraged.   Lipid Panel  Lab Results  Component Value Date   CHOL 145 06/04/2020   HDL 85 06/04/2020   LDLCALC 48 06/04/2020   TRIG 58 06/04/2020   CHOLHDL 2.9 05/06/2019   Controlled, no change in medication    

## 2020-06-23 NOTE — Assessment & Plan Note (Signed)
Patient educated about the importance of limiting  Carbohydrate intake , the need to commit to daily physical activity for a minimum of 30 minutes , and to commit weight loss. The fact that changes in all these areas will reduce or eliminate all together the development of diabetes is stressed.   Diabetic Labs Latest Ref Rng & Units 06/04/2020 03/23/2020 12/03/2019 05/06/2019 04/16/2019  HbA1c 4.8 - 5.6 % 5.9(H) - - 5.9(H) -  Chol 100 - 199 mg/dL 725 - - 366 -  HDL >44 mg/dL 85 - - 68 -  Calc LDL 0 - 99 mg/dL 48 - - 034(V) -  Triglycerides 0 - 149 mg/dL 58 - - 98 -  Creatinine 0.57 - 1.00 mg/dL 4.25 9.56 3.87 5.64(P) 0.80   BP/Weight 06/22/2020 06/22/2020 04/20/2020 04/06/2020 04/02/2020 03/23/2020 02/24/2020  Systolic BP 132 132 151 139 142 142 -  Diastolic BP 70 70 79 84 80 80 -  Wt. (Lbs) 161.75 161.75 155 151 144 144 148  BMI 32.67 32.67 31.31 30.5 29.08 29.08 29.89   No flowsheet data found.

## 2020-06-23 NOTE — Assessment & Plan Note (Signed)
Controlled, no change in medication DASH diet and commitment to daily physical activity for a minimum of 30 minutes discussed and encouraged, as a part of hypertension management. The importance of attaining a healthy weight is also discussed.  BP/Weight 06/22/2020 06/22/2020 04/20/2020 04/06/2020 04/02/2020 03/23/2020 02/24/2020  Systolic BP 132 132 151 139 142 142 -  Diastolic BP 70 70 79 84 80 80 -  Wt. (Lbs) 161.75 161.75 155 151 144 144 148  BMI 32.67 32.67 31.31 30.5 29.08 29.08 29.89

## 2020-06-23 NOTE — Assessment & Plan Note (Signed)
  Patient re-educated about  the importance of commitment to a  minimum of 150 minutes of exercise per week as able.  The importance of healthy food choices with portion control discussed, as well as eating regularly and within a 12 hour window most days. The need to choose "clean , green" food 50 to 75% of the time is discussed, as well as to make water the primary drink and set a goal of 64 ounces water daily.    Weight /BMI 06/22/2020 06/22/2020 04/20/2020  WEIGHT 161 lb 12 oz 161 lb 12 oz 155 lb  HEIGHT 4\' 11"  4\' 11"  4\' 11"   BMI 32.67 kg/m2 32.67 kg/m2 31.31 kg/m2
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
## 2020-06-25 ENCOUNTER — Other Ambulatory Visit: Payer: Self-pay

## 2020-06-25 ENCOUNTER — Ambulatory Visit: Payer: Medicare HMO

## 2020-06-25 DIAGNOSIS — I639 Cerebral infarction, unspecified: Secondary | ICD-10-CM | POA: Diagnosis not present

## 2020-06-25 DIAGNOSIS — R262 Difficulty in walking, not elsewhere classified: Secondary | ICD-10-CM | POA: Diagnosis not present

## 2020-06-25 DIAGNOSIS — M6281 Muscle weakness (generalized): Secondary | ICD-10-CM
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
## 2020-07-01 ENCOUNTER — Ambulatory Visit: Payer: Medicare HMO | Attending: Family Medicine

## 2020-07-01 ENCOUNTER — Other Ambulatory Visit: Payer: Self-pay

## 2020-07-01 DIAGNOSIS — I639 Cerebral infarction, unspecified: Secondary | ICD-10-CM | POA: Diagnosis not present

## 2020-07-01 DIAGNOSIS — M6281 Muscle weakness (generalized): Secondary | ICD-10-CM

## 2020-07-01 DIAGNOSIS — R262 Difficulty in walking, not elsewhere classified: Secondary | ICD-10-CM | POA: Insufficient documentation
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
## 2020-07-03 ENCOUNTER — Ambulatory Visit: Payer: Medicare HMO

## 2020-07-03 ENCOUNTER — Other Ambulatory Visit: Payer: Self-pay

## 2020-07-03 DIAGNOSIS — R262 Difficulty in walking, not elsewhere classified: Secondary | ICD-10-CM | POA: Diagnosis not present

## 2020-07-03 DIAGNOSIS — I639 Cerebral infarction, unspecified: Secondary | ICD-10-CM | POA: Diagnosis not present

## 2020-07-03 DIAGNOSIS — M6281 Muscle weakness (generalized): Secondary | ICD-10-CM | POA: Diagnosis not present
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
## 2020-07-06 ENCOUNTER — Other Ambulatory Visit: Payer: Self-pay

## 2020-07-06 ENCOUNTER — Ambulatory Visit: Payer: Medicare HMO | Admitting: Physical Therapy

## 2020-07-06 DIAGNOSIS — R262 Difficulty in walking, not elsewhere classified: Secondary | ICD-10-CM | POA: Diagnosis not present

## 2020-07-06 DIAGNOSIS — I639 Cerebral infarction, unspecified: Secondary | ICD-10-CM

## 2020-07-06 DIAGNOSIS — M6281 Muscle weakness (generalized): Secondary | ICD-10-CM | POA: Diagnosis not present
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
## 2020-07-08 ENCOUNTER — Ambulatory Visit: Payer: Medicare HMO

## 2020-07-08 ENCOUNTER — Other Ambulatory Visit: Payer: Self-pay

## 2020-07-08 DIAGNOSIS — R262 Difficulty in walking, not elsewhere classified: Secondary | ICD-10-CM | POA: Diagnosis not present

## 2020-07-08 DIAGNOSIS — M6281 Muscle weakness (generalized): Secondary | ICD-10-CM | POA: Diagnosis not present

## 2020-07-08 DIAGNOSIS — I639 Cerebral infarction, unspecified: Secondary | ICD-10-CM | POA: Diagnosis not present
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
## 2020-07-13 ENCOUNTER — Other Ambulatory Visit: Payer: Self-pay

## 2020-07-13 ENCOUNTER — Ambulatory Visit: Payer: Medicare HMO

## 2020-07-13 DIAGNOSIS — R262 Difficulty in walking, not elsewhere classified: Secondary | ICD-10-CM | POA: Diagnosis not present

## 2020-07-13 DIAGNOSIS — M6281 Muscle weakness (generalized): Secondary | ICD-10-CM | POA: Diagnosis not present

## 2020-07-13 DIAGNOSIS — I639 Cerebral infarction, unspecified: Secondary | ICD-10-CM
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
## 2020-07-15 ENCOUNTER — Ambulatory Visit: Payer: Medicare HMO

## 2020-07-15 ENCOUNTER — Other Ambulatory Visit: Payer: Self-pay

## 2020-07-15 DIAGNOSIS — M6281 Muscle weakness (generalized): Secondary | ICD-10-CM | POA: Diagnosis not present

## 2020-07-15 DIAGNOSIS — R262 Difficulty in walking, not elsewhere classified: Secondary | ICD-10-CM

## 2020-07-15 DIAGNOSIS — I639 Cerebral infarction, unspecified: Secondary | ICD-10-CM | POA: Diagnosis not present
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
## 2020-07-20 ENCOUNTER — Other Ambulatory Visit: Payer: Self-pay

## 2020-07-20 ENCOUNTER — Ambulatory Visit: Payer: Medicare HMO

## 2020-07-20 DIAGNOSIS — R262 Difficulty in walking, not elsewhere classified: Secondary | ICD-10-CM | POA: Diagnosis not present

## 2020-07-20 DIAGNOSIS — I639 Cerebral infarction, unspecified: Secondary | ICD-10-CM

## 2020-07-20 DIAGNOSIS — M6281 Muscle weakness (generalized): Secondary | ICD-10-CM
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
## 2020-07-23 ENCOUNTER — Other Ambulatory Visit: Payer: Self-pay

## 2020-07-23 ENCOUNTER — Encounter: Payer: Self-pay | Admitting: Physical Therapy

## 2020-07-23 ENCOUNTER — Ambulatory Visit: Payer: Medicare HMO | Admitting: Physical Therapy

## 2020-07-23 DIAGNOSIS — I639 Cerebral infarction, unspecified: Secondary | ICD-10-CM

## 2020-07-23 DIAGNOSIS — R262 Difficulty in walking, not elsewhere classified: Secondary | ICD-10-CM | POA: Diagnosis not present

## 2020-07-23 DIAGNOSIS — M6281 Muscle weakness (generalized): Secondary | ICD-10-CM | POA: Diagnosis not present
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
## 2020-07-25 DIAGNOSIS — M25561 Pain in right knee: Secondary | ICD-10-CM | POA: Diagnosis not present

## 2020-07-25 DIAGNOSIS — S8001XA Contusion of right knee, initial encounter: Secondary | ICD-10-CM | POA: Diagnosis not present

## 2020-07-25 DIAGNOSIS — M25511 Pain in right shoulder: Secondary | ICD-10-CM | POA: Diagnosis not present

## 2020-07-25 DIAGNOSIS — Z6832 Body mass index (BMI) 32.0-32.9, adult: Secondary | ICD-10-CM | POA: Diagnosis not present

## 2020-07-25 DIAGNOSIS — S40011A Contusion of right shoulder, initial encounter: Secondary | ICD-10-CM | POA: Diagnosis not present

## 2020-07-28 ENCOUNTER — Ambulatory Visit: Payer: Medicare HMO

## 2020-07-28 ENCOUNTER — Other Ambulatory Visit: Payer: Self-pay

## 2020-07-28 DIAGNOSIS — M6281 Muscle weakness (generalized): Secondary | ICD-10-CM | POA: Diagnosis not present

## 2020-07-28 DIAGNOSIS — R262 Difficulty in walking, not elsewhere classified: Secondary | ICD-10-CM

## 2020-07-28 DIAGNOSIS — I639 Cerebral infarction, unspecified: Secondary | ICD-10-CM
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
## 2020-07-30 ENCOUNTER — Ambulatory Visit: Payer: Medicare HMO | Admitting: Physical Therapy

## 2020-07-30 ENCOUNTER — Other Ambulatory Visit: Payer: Self-pay

## 2020-07-30 DIAGNOSIS — I639 Cerebral infarction, unspecified: Secondary | ICD-10-CM

## 2020-07-30 DIAGNOSIS — M6281 Muscle weakness (generalized): Secondary | ICD-10-CM | POA: Diagnosis not present

## 2020-07-30 DIAGNOSIS — R262 Difficulty in walking, not elsewhere classified: Secondary | ICD-10-CM
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
## 2020-08-04 ENCOUNTER — Other Ambulatory Visit: Payer: Self-pay

## 2020-08-04 ENCOUNTER — Ambulatory Visit: Payer: Medicare HMO | Attending: Family Medicine

## 2020-08-04 DIAGNOSIS — I639 Cerebral infarction, unspecified: Secondary | ICD-10-CM | POA: Diagnosis not present

## 2020-08-04 DIAGNOSIS — R262 Difficulty in walking, not elsewhere classified: Secondary | ICD-10-CM | POA: Diagnosis not present

## 2020-08-04 DIAGNOSIS — M6281 Muscle weakness (generalized): Secondary | ICD-10-CM | POA: Insufficient documentation
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
## 2020-08-06 ENCOUNTER — Other Ambulatory Visit: Payer: Self-pay

## 2020-08-06 ENCOUNTER — Ambulatory Visit (INDEPENDENT_AMBULATORY_CARE_PROVIDER_SITE_OTHER): Payer: Medicare HMO | Admitting: Family Medicine

## 2020-08-06 ENCOUNTER — Ambulatory Visit (HOSPITAL_COMMUNITY)
Admission: RE | Admit: 2020-08-06 | Discharge: 2020-08-06 | Disposition: A | Discharge status: Home / Self Care | Payer: Medicare HMO | Source: Ambulatory Visit | Attending: Family Medicine | Admitting: Family Medicine

## 2020-08-06 ENCOUNTER — Encounter: Payer: Self-pay | Admitting: Family Medicine

## 2020-08-06 VITALS — BP 131/74 | HR 80 | Temp 97.7°F | Resp 20 | Ht 59.0 in | Wt 163.0 lb

## 2020-08-06 DIAGNOSIS — I10 Essential (primary) hypertension: Secondary | ICD-10-CM

## 2020-08-06 DIAGNOSIS — E785 Hyperlipidemia, unspecified: Secondary | ICD-10-CM | POA: Diagnosis not present

## 2020-08-06 DIAGNOSIS — R7303 Prediabetes: Secondary | ICD-10-CM

## 2020-08-06 DIAGNOSIS — I6350 Cerebral infarction due to unspecified occlusion or stenosis of unspecified cerebral artery: Secondary | ICD-10-CM

## 2020-08-06 DIAGNOSIS — E559 Vitamin D deficiency, unspecified: Secondary | ICD-10-CM | POA: Diagnosis not present

## 2020-08-06 DIAGNOSIS — U071 COVID-19: Secondary | ICD-10-CM

## 2020-08-06 DIAGNOSIS — J189 Pneumonia, unspecified organism: Secondary | ICD-10-CM

## 2020-08-06 NOTE — Patient Instructions (Signed)
Annual exam in 42months in office, call if you need me sooner  Vit D level today  cXR at hospital today  Careful no more falls!  Good to see improvement with therapy  Thanks for choosing [facility], we consider it a privelige to serve you.

## 2020-08-06 NOTE — Progress Notes (Signed)
Amber Fernandez     MRN: [id number]      DOB: [birth year]   HPI [name] is here for follow up and re-evaluation of chronic medical conditions, medication management and review of any available recent lab and radiology data.  Preventive health is updated, specifically  Cancer screening and Immunization.   Questions or concerns regarding consultations or procedures which the PT has had in the interim are  addressed. The PT denies any adverse reactions to current medications since the last visit.  Larey Seat forward and  hit right knee and right shoulder June 25, back to 80% baseline In pT twice weekly for CVA, making progress ROS Denies recent fever or chills. Denies sinus pressure, nasal congestion, ear pain or sore throat. Denies chest congestion, productive cough or wheezing. Denies chest pains, palpitations and leg swelling Denies abdominal pain, nausea, vomiting,diarrhea or constipation.   Denies dysuria, frequency, hesitancy or incontinence.   PE  BP 131/74 (BP Location: Right Arm, Patient Position: Sitting, Cuff Size: Large)   Pulse 80   Temp 97.7 F (36.5 C)   Resp 20   Ht 4\' 11"  (1.499 m)   Wt 163 lb (73.9 kg)   SpO2 94%   BMI 32.92 kg/m   Patient alert and oriented and in no cardiopulmonary distress.  HEENT: No facial asymmetry, EOMI,     Neck supple .  Chest: Clear to auscultation bilaterally.  CVS: S1, S2 no murmurs, no S3.Regular rate.  ABD: Soft non tender.   Ext: No edema  MS: Adequate ROM spine, shoulders, hips and knees.  Skin: Intact, no ulcerations or rash noted.  Psych: Good eye contact, normal affect. Memory intact not anxious or depressed appearing.  CNS: CN 2-12 intact, grade 4 power in rUE and RLE  Assessment & Plan  Essential hypertension Controlled, no change in medication DASH diet and commitment to daily physical activity for a minimum of 30 minutes discussed and encouraged, as a part of hypertension management. The importance of  attaining a healthy weight is also discussed.  BP/Weight 08/06/2020 06/22/2020 06/22/2020 04/20/2020 04/06/2020 04/02/2020 03/23/2020  Systolic BP 131 132 132 151 139 142 142  Diastolic BP 74 70 70 79 84 80 80  Wt. (Lbs) 163 161.75 161.75 155 151 144 144  BMI 32.92 32.67 32.67 31.31 30.5 29.08 29.08       Dyslipidemia, goal LDL below 70 Hyperlipidemia:Low fat diet discussed and encouraged.   Lipid Panel  Lab Results  Component Value Date   CHOL 145 06/04/2020   HDL 85 06/04/2020   LDLCALC 48 06/04/2020   TRIG 58 06/04/2020   CHOLHDL 2.9 05/06/2019   Controlled, no change in medication     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) improving with physical therapy , reports approx 80 % recovery, she is to complete the course  Prediabetes Patient educated about the importance of limiting  Carbohydrate intake , the need to commit to daily physical activity for a minimum of 30 minutes , and to commit weight loss. The fact that changes in all these areas will reduce or eliminate all together the development of diabetes is stressed.   Diabetic Labs Latest Ref Rng & Units 06/04/2020 03/23/2020 12/03/2019 05/06/2019 04/16/2019  HbA1c 4.8 - 5.6 % 5.9(H) - - 5.9(H) -  Chol 100 - 199 mg/dL 04/18/2019 - - 967 -  HDL 893 mg/dL 85 - - 68 -  Calc LDL 0 - 99 mg/dL 48 - - >81) -  Triglycerides 0 -  149 mg/dL 58 - - 98 -  Creatinine 0.57 - 1.00 mg/dL 4.49 6.75 9.16 3.84(Y) 0.80   BP/Weight 08/06/2020 06/22/2020 06/22/2020 04/20/2020 04/06/2020 04/02/2020 03/23/2020  Systolic BP 131 132 132 151 139 142 142  Diastolic BP 74 70 70 79 84 80 80  Wt. (Lbs) 163 161.75 161.75 155 151 144 144  BMI 32.92 32.67 32.67 31.31 30.5 29.08 29.08   No flowsheet data found.    COVID-19 virus RNA test result positive at limit of detection Needs up dated CXR as had pneumonia, same done on day of visit and shows almost total clearing

## 2020-08-07 ENCOUNTER — Other Ambulatory Visit: Payer: Self-pay | Admitting: Family Medicine

## 2020-08-07 LAB — VITAMIN D 25 HYDROXY (VIT D DEFICIENCY, FRACTURES): Vit D, 25-Hydroxy: 78.2 ng/mL (ref 30.0–100.0)

## 2020-08-07 NOTE — Progress Notes (Signed)
Pt informed

## 2020-08-09 ENCOUNTER — Encounter: Payer: Self-pay | Admitting: Family Medicine

## 2020-08-09 NOTE — Assessment & Plan Note (Signed)
Patient educated about the importance of limiting  Carbohydrate intake , the need to commit to daily physical activity for a minimum of 30 minutes , and to commit weight loss. The fact that changes in all these areas will reduce or eliminate all together the development of diabetes is stressed.   Diabetic Labs Latest Ref Rng & Units 06/04/2020 03/23/2020 12/03/2019 05/06/2019 04/16/2019  HbA1c 4.8 - 5.6 % 5.9(H) - - 5.9(H) -  Chol 100 - 199 mg/dL 654 - - 650 -  HDL >35 mg/dL 85 - - 68 -  Calc LDL 0 - 99 mg/dL 48 - - 465(K) -  Triglycerides 0 - 149 mg/dL 58 - - 98 -  Creatinine 0.57 - 1.00 mg/dL 8.12 7.51 7.00 1.74(B) 0.80   BP/Weight 08/06/2020 06/22/2020 06/22/2020 04/20/2020 04/06/2020 04/02/2020 03/23/2020  Systolic BP 131 132 132 151 139 142 142  Diastolic BP 74 70 70 79 84 80 80  Wt. (Lbs) 163 161.75 161.75 155 151 144 144  BMI 32.92 32.67 32.67 31.31 30.5 29.08 29.08   No flowsheet data found.

## 2020-08-09 NOTE — Assessment & Plan Note (Signed)
Hyperlipidemia:Low fat diet discussed and encouraged.   Lipid Panel  Lab Results  Component Value Date   CHOL 145 06/04/2020   HDL 85 06/04/2020   LDLCALC 48 06/04/2020   TRIG 58 06/04/2020   CHOLHDL 2.9 05/06/2019   Controlled, no change in medication

## 2020-08-09 NOTE — Assessment & Plan Note (Signed)
Controlled, no change in medication DASH diet and commitment to daily physical activity for a minimum of 30 minutes discussed and encouraged, as a part of hypertension management. The importance of attaining a healthy weight is also discussed.  BP/Weight 08/06/2020 06/22/2020 06/22/2020 04/20/2020 04/06/2020 04/02/2020 03/23/2020  Systolic BP 131 132 132 151 139 142 142  Diastolic BP 74 70 70 79 84 80 80  Wt. (Lbs) 163 161.75 161.75 155 151 144 144  BMI 32.92 32.67 32.67 31.31 30.5 29.08 29.08

## 2020-08-09 NOTE — Assessment & Plan Note (Signed)
improving with physical therapy , reports approx 80 % recovery, she is to complete the course

## 2020-08-09 NOTE — Assessment & Plan Note (Signed)
Needs up dated CXR as had pneumonia, same done on day of visit and shows almost total clearing

## 2020-08-10 ENCOUNTER — Other Ambulatory Visit: Payer: Self-pay

## 2020-08-10 ENCOUNTER — Encounter: Payer: Self-pay | Admitting: Physical Therapy

## 2020-08-10 ENCOUNTER — Ambulatory Visit: Payer: Medicare HMO | Admitting: Physical Therapy

## 2020-08-10 DIAGNOSIS — I639 Cerebral infarction, unspecified: Secondary | ICD-10-CM | POA: Diagnosis not present

## 2020-08-10 DIAGNOSIS — R262 Difficulty in walking, not elsewhere classified: Secondary | ICD-10-CM | POA: Diagnosis not present

## 2020-08-10 DIAGNOSIS — M6281 Muscle weakness (generalized): Secondary | ICD-10-CM | POA: Diagnosis not present
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
## 2020-08-12 ENCOUNTER — Ambulatory Visit: Payer: Medicare HMO | Admitting: Physical Therapy

## 2020-08-12 ENCOUNTER — Other Ambulatory Visit: Payer: Self-pay

## 2020-08-12 DIAGNOSIS — M6281 Muscle weakness (generalized): Secondary | ICD-10-CM

## 2020-08-12 DIAGNOSIS — R262 Difficulty in walking, not elsewhere classified: Secondary | ICD-10-CM | POA: Diagnosis not present

## 2020-08-12 DIAGNOSIS — I639 Cerebral infarction, unspecified: Secondary | ICD-10-CM
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
## 2020-08-17 ENCOUNTER — Other Ambulatory Visit: Payer: Self-pay

## 2020-08-17 ENCOUNTER — Ambulatory Visit: Payer: Medicare HMO

## 2020-08-17 DIAGNOSIS — I639 Cerebral infarction, unspecified: Secondary | ICD-10-CM

## 2020-08-17 DIAGNOSIS — M6281 Muscle weakness (generalized): Secondary | ICD-10-CM | POA: Diagnosis not present

## 2020-08-17 DIAGNOSIS — R262 Difficulty in walking, not elsewhere classified: Secondary | ICD-10-CM
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
## 2020-08-19 ENCOUNTER — Ambulatory Visit: Payer: Medicare HMO

## 2020-08-19 ENCOUNTER — Other Ambulatory Visit: Payer: Self-pay

## 2020-08-19 DIAGNOSIS — M6281 Muscle weakness (generalized): Secondary | ICD-10-CM

## 2020-08-19 DIAGNOSIS — R262 Difficulty in walking, not elsewhere classified: Secondary | ICD-10-CM | POA: Diagnosis not present

## 2020-08-19 DIAGNOSIS — I639 Cerebral infarction, unspecified: Secondary | ICD-10-CM | POA: Diagnosis not present
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
## 2020-08-24 ENCOUNTER — Other Ambulatory Visit: Payer: Self-pay

## 2020-08-24 ENCOUNTER — Ambulatory Visit: Payer: Medicare HMO | Admitting: Physical Therapy

## 2020-08-24 DIAGNOSIS — M6281 Muscle weakness (generalized): Secondary | ICD-10-CM

## 2020-08-24 DIAGNOSIS — R262 Difficulty in walking, not elsewhere classified: Secondary | ICD-10-CM

## 2020-08-24 DIAGNOSIS — I639 Cerebral infarction, unspecified: Secondary | ICD-10-CM

## 2020-08-24 NOTE — Patient Instructions (Signed)
  WALL PUSH UPS  Standing at a wall, place your arms out in front of you with your elbows straight so that your hands just reach the wall.  Next, bend your elbows slowly to bring your chest closer to the wall. Maintain your feet planted on the ground the entire time.   Shoulder Scaption - AAROM with Wand  Stand holding a wand/cane/dowel with your affected arm on top, palm facing forward. Use your unaffected arm to move your affected arm out at a diagonal. Keep shoulders level and your affected arm relaxed. Slowly return to starting position and repeat.    CANE EXTERNAL ROTATION STRETCH - PILLOW  Lie on your back with a pillow under your affect shoulder. Hold a cane or wand with both hands.   On the affected side, maintain a 90 degree bend at the elbow with your arm approximately 30-45 degrees away from your side.   Strengthening: Isometric Flexion   Using wall for resistance, press right fist into ball using light pressure. Hold __5__ seconds. Repeat __10__ times per set. Do __2__ sets per session. Do _2___ sessions per day.  http://orth.exer.us/800   Copyright  VHI. All rights reserved.  Strengthening: Isometric Extension   Using wall for resistance, press back of left arm into ball using light pressure. Hold _5___ seconds. Repeat __10__ times per set. Do __2__ sets per session. Do __2__ sessions per day.  http://orth.exer.us/804   Copyright  VHI. All rights reserved.  Strengthening: Isometric External Rotation   Using wall to provide resistance, and keeping right arm at side, press back of hand into ball using light pressure. Hold __5__ seconds. Repeat __10__ times per set. Do _2___ sets per session. Do __2__ sessions per day.  http://orth.exer.us/814   Copyright  VHI. All rights reserved.  Strengthening: Isometric Internal Rotation   Using door frame for resistance, press palm of right hand into ball using light pressure. Keep elbow in at side. Hold _5___  seconds. Repeat __10__ times per set. Do _2___ sets per session. Do __2__ sessions per day.  http://orth.exer.us/816   Copyright  VHI. All rights reserved.
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
## 2020-08-26 ENCOUNTER — Other Ambulatory Visit: Payer: Self-pay

## 2020-08-26 ENCOUNTER — Ambulatory Visit: Payer: Medicare HMO

## 2020-08-26 DIAGNOSIS — R262 Difficulty in walking, not elsewhere classified: Secondary | ICD-10-CM | POA: Diagnosis not present

## 2020-08-26 DIAGNOSIS — M6281 Muscle weakness (generalized): Secondary | ICD-10-CM | POA: Diagnosis not present

## 2020-08-26 DIAGNOSIS — I639 Cerebral infarction, unspecified: Secondary | ICD-10-CM

## 2020-08-26 NOTE — Addendum Note (Signed)
Addended by: [name] on: 08/26/2020 03:44 PM   Modules accepted: Orders
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
## 2020-08-31 ENCOUNTER — Other Ambulatory Visit: Payer: Self-pay

## 2020-08-31 ENCOUNTER — Ambulatory Visit: Payer: Medicare HMO | Attending: Family Medicine

## 2020-08-31 DIAGNOSIS — I639 Cerebral infarction, unspecified: Secondary | ICD-10-CM

## 2020-08-31 DIAGNOSIS — R262 Difficulty in walking, not elsewhere classified: Secondary | ICD-10-CM

## 2020-08-31 DIAGNOSIS — M6281 Muscle weakness (generalized): Secondary | ICD-10-CM | POA: Diagnosis not present
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
## 2020-09-03 ENCOUNTER — Other Ambulatory Visit: Payer: Self-pay

## 2020-09-03 ENCOUNTER — Ambulatory Visit: Payer: Medicare HMO | Admitting: Physical Therapy

## 2020-09-08 ENCOUNTER — Encounter: Payer: Medicare HMO | Admitting: Physical Therapy

## 2020-09-10 ENCOUNTER — Ambulatory Visit: Payer: Medicare HMO | Admitting: Physical Therapy

## 2020-09-10 ENCOUNTER — Other Ambulatory Visit: Payer: Self-pay

## 2020-09-10 DIAGNOSIS — I639 Cerebral infarction, unspecified: Secondary | ICD-10-CM | POA: Diagnosis not present

## 2020-09-10 DIAGNOSIS — M6281 Muscle weakness (generalized): Secondary | ICD-10-CM | POA: Diagnosis not present

## 2020-09-10 DIAGNOSIS — R262 Difficulty in walking, not elsewhere classified: Secondary | ICD-10-CM | POA: Diagnosis not present
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
## 2020-09-16 ENCOUNTER — Other Ambulatory Visit: Payer: Self-pay

## 2020-09-16 ENCOUNTER — Ambulatory Visit: Payer: Medicare HMO

## 2020-09-16 DIAGNOSIS — R262 Difficulty in walking, not elsewhere classified: Secondary | ICD-10-CM | POA: Diagnosis not present

## 2020-09-16 DIAGNOSIS — I639 Cerebral infarction, unspecified: Secondary | ICD-10-CM

## 2020-09-16 DIAGNOSIS — M6281 Muscle weakness (generalized): Secondary | ICD-10-CM
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
## 2020-09-22 ENCOUNTER — Other Ambulatory Visit: Payer: Self-pay

## 2020-09-22 ENCOUNTER — Telehealth: Payer: Self-pay

## 2020-09-22 DIAGNOSIS — L299 Pruritus, unspecified: Secondary | ICD-10-CM

## 2020-09-22 MED ORDER — LOTRISONE 1-0.05 % EX CREA: 1.0000 "application " | TOPICAL_CREAM | Freq: Two times a day (BID) | CUTANEOUS | 0 refills | Status: DC

## 2020-09-22 NOTE — Telephone Encounter (Signed)
Brand name sent- probably not covered by insurance

## 2020-09-22 NOTE — Telephone Encounter (Signed)
Patient called need refill, she asked not to have the generic, would like the brand name.  clotrimazole-betamethasone (LOTRISONE) cream [[id number]]   Pharmacy: [facility]

## 2020-09-23 ENCOUNTER — Ambulatory Visit: Payer: Medicare HMO

## 2020-09-23 ENCOUNTER — Other Ambulatory Visit: Payer: Self-pay

## 2020-09-23 DIAGNOSIS — I639 Cerebral infarction, unspecified: Secondary | ICD-10-CM

## 2020-09-23 DIAGNOSIS — R262 Difficulty in walking, not elsewhere classified: Secondary | ICD-10-CM | POA: Diagnosis not present

## 2020-09-23 DIAGNOSIS — M6281 Muscle weakness (generalized): Secondary | ICD-10-CM
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
## 2020-09-30 ENCOUNTER — Ambulatory Visit: Payer: Medicare HMO

## 2020-09-30 ENCOUNTER — Other Ambulatory Visit: Payer: Self-pay

## 2020-09-30 DIAGNOSIS — I639 Cerebral infarction, unspecified: Secondary | ICD-10-CM

## 2020-09-30 DIAGNOSIS — R262 Difficulty in walking, not elsewhere classified: Secondary | ICD-10-CM

## 2020-09-30 DIAGNOSIS — M6281 Muscle weakness (generalized): Secondary | ICD-10-CM
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
## 2020-11-02 ENCOUNTER — Other Ambulatory Visit: Payer: Self-pay | Admitting: Family Medicine

## 2020-11-27 ENCOUNTER — Other Ambulatory Visit: Payer: Self-pay | Admitting: Family Medicine

## 2020-12-14 ENCOUNTER — Encounter: Payer: Medicare HMO | Admitting: Family Medicine

## 2020-12-17 ENCOUNTER — Other Ambulatory Visit: Payer: Self-pay

## 2020-12-17 ENCOUNTER — Ambulatory Visit (INDEPENDENT_AMBULATORY_CARE_PROVIDER_SITE_OTHER): Payer: Medicare HMO | Admitting: Family Medicine

## 2020-12-17 ENCOUNTER — Encounter: Payer: Self-pay | Admitting: Family Medicine

## 2020-12-17 VITALS — BP 133/80 | HR 95 | Resp 18 | Ht 59.0 in | Wt 168.1 lb

## 2020-12-17 DIAGNOSIS — M8580 Other specified disorders of bone density and structure, unspecified site: Secondary | ICD-10-CM

## 2020-12-17 DIAGNOSIS — Z23 Encounter for immunization: Secondary | ICD-10-CM | POA: Diagnosis not present

## 2020-12-17 DIAGNOSIS — Z0000 Encounter for general adult medical examination without abnormal findings: Secondary | ICD-10-CM | POA: Diagnosis not present

## 2020-12-17 DIAGNOSIS — R7303 Prediabetes: Secondary | ICD-10-CM

## 2020-12-17 DIAGNOSIS — I10 Essential (primary) hypertension: Secondary | ICD-10-CM

## 2020-12-17 DIAGNOSIS — L299 Pruritus, unspecified: Secondary | ICD-10-CM

## 2020-12-17 DIAGNOSIS — Z1231 Encounter for screening mammogram for malignant neoplasm of breast: Secondary | ICD-10-CM

## 2020-12-17 MED ORDER — LOTRISONE 1-0.05 % EX CREA: 1.0000 "application " | TOPICAL_CREAM | Freq: Two times a day (BID) | CUTANEOUS | 0 refills | Status: DC

## 2020-12-17 NOTE — Progress Notes (Signed)
    Amber Fernandez     MRN: [id number]      DOB: 11-Feb-1945  HPI: Patient is in for annual physical exam. No other health concerns are expressed or addressed at the visit. Recent labs,  are reviewed. Immunization is reviewed , and  updated if needed.   PE: BP 133/80   Pulse 95   Resp 18   Ht 4\' 11"  (1.499 m)   Wt 168 lb 1.3 oz (76.2 kg)   SpO2 96%   BMI 33.95 kg/m   Pleasant  female, alert and oriented x 3, in no cardio-pulmonary distress. Afebrile. HEENT No facial trauma or asymetry. Sinuses non tender.  Extra occullar muscles intact.. External ears normal, . Neck: supple, no adenopathy,JVD or thyromegaly.No bruits.  Chest: Clear to ascultation bilaterally.No crackles or wheezes. Non tender to palpation    Cardiovascular system; Heart sounds normal,  S1 and  S2 ,no S3.  No murmur, or thrill. Apical beat not displaced Peripheral pulses normal.  Abdomen: Soft, non tender, No guarding, tenderness or rebound.    Musculoskeletal exam: Decreased  ROM of spine, hips , shoulders and knees. No deformity ,swelling or crepitus noted. No muscle wasting or atrophy.   Neurologic: Cranial nerves 2 to 12 intact. Grade 4 power in in right upper and lower extremities Ambulates with a cane  disturbance in gait. No tremor.  Skin: Intact, no ulceration, erythema , scaling or rash noted. Pigmentation normal throughout  Psych; Normal mood and affect. Judgement and concentration normal   Assessment & Plan:  Annual exam as documented. Counseling done  re healthy lifestyle involving commitment to 150 minutes exercise per week, heart healthy diet, and attaining healthy weight.The importance of adequate sleep also discussed. Regular seat belt use and home safety, is also discussed. Changes in health habits are decided on by the patient with goals and time frames  set for achieving them. Immunization and cancer screening needs are specifically addressed at this visit.

## 2020-12-17 NOTE — Assessment & Plan Note (Signed)

## 2020-12-17 NOTE — Patient Instructions (Addendum)
F/U in 6 months, call if you need me sooner  Flu vaccine today   Use stationary bike, not treadmill for exercise   Lipid, cmp and EGFR and hBA1C today  Please schedule mammogram at checkout and dexa   Think about what you will eat, plan ahead. Choose " clean, green, fresh or frozen" over canned, processed or packaged foods which are more sugary, salty and fatty. 70 to 75% of food eaten should be vegetables and fruit. Three meals at set times with snacks allowed between meals, but they must be fruit or vegetables. Aim to eat over a 12 hour period , example 7 am to 7 pm, and STOP after  your last meal of the day. Drink water,generally about 64 ounces per day, no other drink is as healthy. Fruit juice is best enjoyed in a healthy way, by EATING the fruit.  Thanks for choosing [facility], we consider it a privelige to serve you.

## 2020-12-18 LAB — LIPID PANEL
Chol/HDL Ratio: 2.3 ratio (ref 0.0–4.4)
Cholesterol, Total: 136 mg/dL (ref 100–199)
HDL: 58 mg/dL (ref >39)
LDL Chol Calc (NIH): 61 mg/dL (ref 0–99)
Triglycerides: 88 mg/dL (ref 0–149)
VLDL Cholesterol Cal: 17 mg/dL (ref 5–40)

## 2020-12-18 LAB — CMP14+EGFR
ALT: 16 IU/L (ref 0–32)
AST: 20 IU/L (ref 0–40)
Albumin/Globulin Ratio: 1.4 (ref 1.2–2.2)
Albumin: 4.4 g/dL (ref 3.7–4.7)
Alkaline Phosphatase: 98 IU/L (ref 44–121)
BUN/Creatinine Ratio: 19 (ref 12–28)
BUN: 14 mg/dL (ref 8–27)
Bilirubin Total: 0.4 mg/dL (ref 0.0–1.2)
CO2: 22 mmol/L (ref 20–29)
Calcium: 9.6 mg/dL (ref 8.7–10.3)
Chloride: 106 mmol/L (ref 96–106)
Creatinine, Ser: 0.73 mg/dL (ref 0.57–1.00)
Globulin, Total: 3.1 g/dL (ref 1.5–4.5)
Glucose: 100 mg/dL — ABNORMAL HIGH (ref 70–99)
Potassium: 4.2 mmol/L (ref 3.5–5.2)
Sodium: 143 mmol/L (ref 134–144)
Total Protein: 7.5 g/dL (ref 6.0–8.5)
eGFR: 87 mL/min/{1.73_m2} (ref >59)

## 2020-12-18 LAB — HEMOGLOBIN A1C
Est. average glucose Bld gHb Est-mCnc: 131 mg/dL
Hgb A1c MFr Bld: 6.2 % — ABNORMAL HIGH (ref 4.8–5.6)

## 2021-01-13 ENCOUNTER — Encounter: Payer: Self-pay | Admitting: Nurse Practitioner

## 2021-01-13 ENCOUNTER — Other Ambulatory Visit: Payer: Self-pay

## 2021-01-13 ENCOUNTER — Ambulatory Visit (INDEPENDENT_AMBULATORY_CARE_PROVIDER_SITE_OTHER): Payer: Medicare HMO | Admitting: Nurse Practitioner

## 2021-01-13 DIAGNOSIS — J069 Acute upper respiratory infection, unspecified: Secondary | ICD-10-CM | POA: Diagnosis not present

## 2021-01-13 MED ORDER — CORICIDIN HBP COUGH/COLD 4-30 MG PO TABS: 1.0000 | ORAL_TABLET | Freq: Four times a day (QID) | ORAL | 0 refills | Status: DC | PRN

## 2021-01-13 MED ORDER — PREDNISONE 10 MG PO TABS: ORAL_TABLET | ORAL | 0 refills | Status: AC

## 2021-01-13 MED ORDER — GUAIFENESIN ER 600 MG PO TB12: 600.0000 mg | ORAL_TABLET | Freq: Two times a day (BID) | ORAL | 0 refills | Status: DC

## 2021-01-13 NOTE — Assessment & Plan Note (Addendum)
-  get COVID and flu swab today -Rx. Robitussin-DM, prednisone, and coricidin -if no improvement over the weekend, will consider abx
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
## 2021-01-20 ENCOUNTER — Ambulatory Visit (HOSPITAL_COMMUNITY)
Admission: RE | Admit: 2021-01-20 | Discharge: 2021-01-20 | Disposition: A | Discharge status: Home / Self Care | Payer: Medicare HMO | Source: Ambulatory Visit | Attending: Family Medicine | Admitting: Family Medicine

## 2021-01-20 ENCOUNTER — Other Ambulatory Visit: Payer: Self-pay

## 2021-01-20 DIAGNOSIS — Z1231 Encounter for screening mammogram for malignant neoplasm of breast: Secondary | ICD-10-CM | POA: Diagnosis not present

## 2021-01-20 DIAGNOSIS — Z780 Asymptomatic menopausal state: Secondary | ICD-10-CM | POA: Insufficient documentation

## 2021-01-20 DIAGNOSIS — M8580 Other specified disorders of bone density and structure, unspecified site: Secondary | ICD-10-CM

## 2021-01-20 DIAGNOSIS — Z13820 Encounter for screening for osteoporosis: Secondary | ICD-10-CM | POA: Insufficient documentation

## 2021-01-20 DIAGNOSIS — M85851 Other specified disorders of bone density and structure, right thigh: Secondary | ICD-10-CM | POA: Insufficient documentation

## 2021-02-23 DIAGNOSIS — E109 Type 1 diabetes mellitus without complications: Secondary | ICD-10-CM | POA: Diagnosis not present

## 2021-02-23 DIAGNOSIS — E7800 Pure hypercholesterolemia, unspecified: Secondary | ICD-10-CM | POA: Diagnosis not present

## 2021-02-23 DIAGNOSIS — Z0100 Encounter for examination of eyes and vision without abnormal findings: Secondary | ICD-10-CM | POA: Diagnosis not present

## 2021-02-23 DIAGNOSIS — H5200 Hypermetropia, unspecified eye: Secondary | ICD-10-CM | POA: Diagnosis not present

## 2021-05-18 ENCOUNTER — Other Ambulatory Visit: Payer: Self-pay

## 2021-05-18 MED ORDER — ASPIRIN 81 MG PO CHEW: 81.0000 mg | CHEWABLE_TABLET | Freq: Every day | ORAL | 2 refills | Status: DC

## 2021-06-05 DIAGNOSIS — B09 Unspecified viral infection characterized by skin and mucous membrane lesions: Secondary | ICD-10-CM | POA: Diagnosis not present

## 2021-06-05 DIAGNOSIS — L03113 Cellulitis of right upper limb: Secondary | ICD-10-CM | POA: Diagnosis not present

## 2021-06-07 DIAGNOSIS — L03113 Cellulitis of right upper limb: Secondary | ICD-10-CM | POA: Diagnosis not present

## 2021-06-09 DIAGNOSIS — L089 Local infection of the skin and subcutaneous tissue, unspecified: Secondary | ICD-10-CM | POA: Diagnosis not present

## 2021-06-09 DIAGNOSIS — L03113 Cellulitis of right upper limb: Secondary | ICD-10-CM | POA: Diagnosis not present

## 2021-06-09 DIAGNOSIS — Z6832 Body mass index (BMI) 32.0-32.9, adult: Secondary | ICD-10-CM | POA: Diagnosis not present

## 2021-06-14 ENCOUNTER — Other Ambulatory Visit: Payer: Self-pay | Admitting: Family Medicine

## 2021-06-14 DIAGNOSIS — Z6832 Body mass index (BMI) 32.0-32.9, adult: Secondary | ICD-10-CM | POA: Diagnosis not present

## 2021-06-14 DIAGNOSIS — L03113 Cellulitis of right upper limb: Secondary | ICD-10-CM | POA: Diagnosis not present

## 2021-06-16 ENCOUNTER — Ambulatory Visit: Payer: Medicare HMO | Admitting: Family Medicine

## 2021-06-23 ENCOUNTER — Encounter: Payer: Self-pay | Admitting: Family Medicine

## 2021-06-23 ENCOUNTER — Ambulatory Visit (INDEPENDENT_AMBULATORY_CARE_PROVIDER_SITE_OTHER): Payer: Medicare HMO | Admitting: Family Medicine

## 2021-06-23 VITALS — BP 122/78 | HR 71 | Ht 59.0 in | Wt 156.0 lb

## 2021-06-23 DIAGNOSIS — E785 Hyperlipidemia, unspecified: Secondary | ICD-10-CM

## 2021-06-23 DIAGNOSIS — Z1231 Encounter for screening mammogram for malignant neoplasm of breast: Secondary | ICD-10-CM | POA: Diagnosis not present

## 2021-06-23 DIAGNOSIS — I10 Essential (primary) hypertension: Secondary | ICD-10-CM | POA: Diagnosis not present

## 2021-06-23 DIAGNOSIS — R7303 Prediabetes: Secondary | ICD-10-CM

## 2021-06-23 DIAGNOSIS — I6350 Cerebral infarction due to unspecified occlusion or stenosis of unspecified cerebral artery: Secondary | ICD-10-CM

## 2021-06-23 DIAGNOSIS — J069 Acute upper respiratory infection, unspecified: Secondary | ICD-10-CM | POA: Diagnosis not present

## 2021-06-23 DIAGNOSIS — E559 Vitamin D deficiency, unspecified: Secondary | ICD-10-CM

## 2021-06-23 NOTE — Patient Instructions (Signed)
Annual exam in Novemebr, when due, call if you need me sooner   Please schedule wellness exam, overdue  Labs today, cBC, lipid, cmp and eGFR, tSH, hBA1c AND VIT d  You may discontinue aspirin, not indicated.  Please schedule mammogram at checkout  Thanks for choosing [facility], we consider it a privelige to serve you.

## 2021-06-23 NOTE — Assessment & Plan Note (Signed)
Hyperlipidemia:Low fat diet discussed and encouraged.   Lipid Panel  Lab Results  Component Value Date   CHOL 136 12/17/2020   HDL 58 12/17/2020   LDLCALC 61 12/17/2020   TRIG 88 12/17/2020   CHOLHDL 2.3 12/17/2020     Updated lab needed at/ before next visit.

## 2021-06-23 NOTE — Assessment & Plan Note (Signed)
Controlled, no change in medication DASH diet and commitment to daily physical activity for a minimum of 30 minutes discussed and encouraged, as a part of hypertension management. The importance of attaining a healthy weight is also discussed.     06/23/2021    8:16 AM 12/17/2020    1:18 PM 08/06/2020    2:00 PM 06/22/2020    1:31 PM 06/22/2020    1:08 PM 04/20/2020    1:59 PM 04/06/2020   11:36 AM  BP/Weight  Systolic BP 122 133 131 132 132 151 139  Diastolic BP 78 80 74 70 70 79 84  Wt. (Lbs) 156 168.08 163 161.75 161.75 155 151  BMI 31.51 kg/m2 33.95 kg/m2 32.92 kg/m2 32.67 kg/m2 32.67 kg/m2 31.31 kg/m2 30.5 kg/m2

## 2021-06-23 NOTE — Assessment & Plan Note (Signed)
Patient educated about the importance of limiting  Carbohydrate intake , the need to commit to daily physical activity for a minimum of 30 minutes , and to commit weight loss. The fact that changes in all these areas will reduce or eliminate all together the development of diabetes is stressed.      Latest Ref Rng & Units 12/17/2020    2:17 PM 06/04/2020    2:00 PM 06/04/2020    1:59 PM 03/23/2020   10:24 AM 12/03/2019    8:41 AM  Diabetic Labs  HbA1c 4.8 - 5.6 % 6.2   5.9       Chol 100 - 199 mg/dL 001    749      HDL >44 mg/dL 58    85      Calc LDL 0 - 99 mg/dL 61    48      Triglycerides 0 - 149 mg/dL 88    58      Creatinine 0.57 - 1.00 mg/dL 9.67    5.91   6.38   4.66        06/23/2021    8:16 AM 12/17/2020    1:18 PM 08/06/2020    2:00 PM 06/22/2020    1:31 PM 06/22/2020    1:08 PM 04/20/2020    1:59 PM 04/06/2020   11:36 AM  BP/Weight  Systolic BP 122 133 131 132 132 151 139  Diastolic BP 78 80 74 70 70 79 84  Wt. (Lbs) 156 168.08 163 161.75 161.75 155 151  BMI 31.51 kg/m2 33.95 kg/m2 32.92 kg/m2 32.67 kg/m2 32.67 kg/m2 31.31 kg/m2 30.5 kg/m2       View : No data to display.          Updated lab needed at/ before next visit.

## 2021-06-23 NOTE — Assessment & Plan Note (Signed)
Updated lab needed at/ before next visit.   

## 2021-06-23 NOTE — Progress Notes (Signed)
Amber Fernandez     MRN: [id number]      DOB: February 15, 1945   HPI Amber Fernandez is here for follow up and re-evaluation of chronic medical conditions, medication management and review of any available recent lab and radiology data.  Preventive health is updated, specifically  Cancer screening and Immunization.   Questions or concerns regarding consultations or procedures which the PT has had in the interim are  addressed. The PT denies any adverse reactions to current medications since the last visit.  States needs toi go back to work even few hours per day for a short while, main obligation is financial, states no longer uses cane and drives locally  ROS Denies recent fever or chills. Denies sinus pressure, nasal congestion, ear pain or sore throat. Denies chest congestion, productive cough or wheezing. Denies chest pains, palpitations and leg swelling Denies abdominal pain, nausea, vomiting,diarrhea or constipation.   Denies dysuria, frequency, hesitancy or incontinence. Denies joint pain, swelling and limitation in mobility. Denies headaches, seizures, numbness, or tingling. Denies depression, anxiety or insomnia. Denies skin break down or rash.   PE  BP 122/78   Pulse 71   Ht 4\' 11"  (1.499 m)   Wt 156 lb (70.8 kg)   SpO2 96%   BMI 31.51 kg/m   Patient alert and oriented and in no cardiopulmonary distress.  HEENT: No facial asymmetry, EOMI,     Neck supple .  Chest: Clear to auscultation bilaterally.  CVS: S1, S2 no murmurs, no S3.Regular rate.  ABD: Soft non tender.   Ext: No edema  MS: Adequate ROM spine, shoulders, hips and knees.Abnormal gait  Skin: Intact, no ulcerations or rash noted.  Psych: Good eye contact, normal affect. Memory intact not anxious or depressed appearing. 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 The importance of attaining a healthy weight is also discussed.     06/23/2021    8:16 AM 12/17/2020    1:18 PM 08/06/2020    2:00 PM 06/22/2020    1:31 PM 06/22/2020    1:08 PM 04/20/2020    1:59 PM 04/06/2020   11:36 AM  BP/Weight  Systolic BP 122 133 131 132 132 151 139  Diastolic BP 78 80 74 70 70 79 84  Wt. (Lbs) 156 168.08 163 161.75 161.75 155 151  BMI 31.51 kg/m2 33.95 kg/m2 32.92 kg/m2 32.67 kg/m2 32.67 kg/m2 31.31 kg/m2 30.5 kg/m2       Dyslipidemia, goal LDL below 70 Hyperlipidemia:Low fat diet discussed and encouraged.   Lipid Panel  Lab Results  Component Value Date   CHOL 136 12/17/2020   HDL 58 12/17/2020   LDLCALC 61 12/17/2020   TRIG 88 12/17/2020   CHOLHDL 2.3 12/17/2020     Updated lab needed at/ before next visit.   Prediabetes Patient educated about the importance of limiting  Carbohydrate intake , the need to commit to daily physical activity for a minimum of 30 minutes , and to commit weight loss. The fact that changes in all these areas will reduce or eliminate all together the development of diabetes is stressed.      Latest Ref Rng & Units  12/17/2020    2:17 PM 06/04/2020    2:00 PM 06/04/2020    1:59 PM 03/23/2020   10:24 AM 12/03/2019    8:41 AM  Diabetic Labs  HbA1c 4.8 - 5.6 % 6.2   5.9       Chol 100 - 199 mg/dL 250    539      HDL >76 mg/dL 58    85      Calc LDL 0 - 99 mg/dL 61    48      Triglycerides 0 - 149 mg/dL 88    58      Creatinine 0.57 - 1.00 mg/dL 7.34    1.93   7.90   2.40        06/23/2021    8:16 AM 12/17/2020    1:18 PM 08/06/2020    2:00 PM 06/22/2020    1:31 PM 06/22/2020    1:08 PM 04/20/2020    1:59 PM 04/06/2020   11:36 AM  BP/Weight  Systolic BP 122 133 131 132 132 151 139   Diastolic BP 78 80 74 70 70 79 84  Wt. (Lbs) 156 168.08 163 161.75 161.75 155 151  BMI 31.51 kg/m2 33.95 kg/m2 32.92 kg/m2 32.67 kg/m2 32.67 kg/m2 31.31 kg/m2 30.5 kg/m2       View : No data to display.          Updated lab needed at/ before next visit.   Upper respiratory tract infection Updated lab needed at/ before next visit.

## 2021-06-23 NOTE — Assessment & Plan Note (Signed)
Residual right sided weakness. 

## 2021-06-24 LAB — CMP14+EGFR
ALT: 25 IU/L (ref 0–32)
AST: 30 IU/L (ref 0–40)
Albumin/Globulin Ratio: 1.6 (ref 1.2–2.2)
Albumin: 4.7 g/dL (ref 3.7–4.7)
Alkaline Phosphatase: 94 IU/L (ref 44–121)
BUN/Creatinine Ratio: 11 — ABNORMAL LOW (ref 12–28)
BUN: 10 mg/dL (ref 8–27)
Bilirubin Total: 0.4 mg/dL (ref 0.0–1.2)
CO2: 21 mmol/L (ref 20–29)
Calcium: 10 mg/dL (ref 8.7–10.3)
Chloride: 106 mmol/L (ref 96–106)
Creatinine, Ser: 0.91 mg/dL (ref 0.57–1.00)
Globulin, Total: 3 g/dL (ref 1.5–4.5)
Glucose: 119 mg/dL — ABNORMAL HIGH (ref 70–99)
Potassium: 4.8 mmol/L (ref 3.5–5.2)
Sodium: 143 mmol/L (ref 134–144)
Total Protein: 7.7 g/dL (ref 6.0–8.5)
eGFR: 67 mL/min/{1.73_m2} (ref >59)

## 2021-06-24 LAB — CBC
Hematocrit: 42.7 % (ref 34.0–46.6)
Hemoglobin: 14.3 g/dL (ref 11.1–15.9)
MCH: 26.5 pg — ABNORMAL LOW (ref 26.6–33.0)
MCHC: 33.5 g/dL (ref 31.5–35.7)
MCV: 79 fL (ref 79–97)
Platelets: 237 10*3/uL (ref 150–450)
RBC: 5.4 x10E6/uL — ABNORMAL HIGH (ref 3.77–5.28)
RDW: 14.4 % (ref 11.7–15.4)
WBC: 4.5 10*3/uL (ref 3.4–10.8)

## 2021-06-24 LAB — LIPID PANEL
Chol/HDL Ratio: 2.3 ratio (ref 0.0–4.4)
Cholesterol, Total: 138 mg/dL (ref 100–199)
HDL: 59 mg/dL (ref >39)
LDL Chol Calc (NIH): 62 mg/dL (ref 0–99)
Triglycerides: 92 mg/dL (ref 0–149)
VLDL Cholesterol Cal: 17 mg/dL (ref 5–40)

## 2021-06-24 LAB — VITAMIN D 25 HYDROXY (VIT D DEFICIENCY, FRACTURES): Vit D, 25-Hydroxy: 51.9 ng/mL (ref 30.0–100.0)

## 2021-06-24 LAB — HEMOGLOBIN A1C
Est. average glucose Bld gHb Est-mCnc: 131 mg/dL
Hgb A1c MFr Bld: 6.2 % — ABNORMAL HIGH (ref 4.8–5.6)

## 2021-06-24 LAB — TSH: TSH: 3.46 u[IU]/mL (ref 0.450–4.500)
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
## 2021-07-05 ENCOUNTER — Ambulatory Visit (INDEPENDENT_AMBULATORY_CARE_PROVIDER_SITE_OTHER): Payer: Medicare HMO

## 2021-07-05 VITALS — BP 122/76

## 2021-07-05 DIAGNOSIS — Z0000 Encounter for general adult medical examination without abnormal findings: Secondary | ICD-10-CM | POA: Diagnosis not present

## 2021-07-05 NOTE — Patient Instructions (Signed)
  Amber Fernandez , Thank you for taking time to come for your Medicare Wellness Visit. I appreciate your ongoing commitment to your health goals. Please review the following plan we discussed and let me know if I can assist you in the future.   Schedule your mammogram at checkout   Everything else is up to date. Great job!    These are the goals we discussed:  Goals       Increase physical activity (pt-stated)      Gardening and being outside in the sunshine       Patient Stated      I would like to be able to use my right arm.         This is a list of the screening recommended for you and due dates:  Health Maintenance  Topic Date Due   COVID-19 Vaccine (1) 07/08/2021*   Flu Shot  08/31/2021   Mammogram  01/20/2022   Colon Cancer Screening  07/06/2023   Tetanus Vaccine  04/28/2027   Pneumonia Vaccine  Completed   DEXA scan (bone density measurement)  Completed   Hepatitis C Screening: USPSTF Recommendation to screen - Ages 82-79 yo.  Completed   Zoster (Shingles) Vaccine  Completed   HPV Vaccine  Aged Out   Urine Protein Check  Discontinued  *Topic was postponed. The date shown is not the original due date.

## 2021-09-16 DIAGNOSIS — S61501A Unspecified open wound of right wrist, initial encounter: Secondary | ICD-10-CM | POA: Diagnosis not present

## 2021-09-16 DIAGNOSIS — L03113 Cellulitis of right upper limb: Secondary | ICD-10-CM | POA: Diagnosis not present

## 2021-09-20 DIAGNOSIS — Z5189 Encounter for other specified aftercare: Secondary | ICD-10-CM | POA: Diagnosis not present

## 2021-09-20 DIAGNOSIS — Z6832 Body mass index (BMI) 32.0-32.9, adult: Secondary | ICD-10-CM | POA: Diagnosis not present

## 2021-09-20 DIAGNOSIS — L03113 Cellulitis of right upper limb: Secondary | ICD-10-CM | POA: Diagnosis not present

## 2021-09-24 ENCOUNTER — Encounter (HOSPITAL_COMMUNITY): Payer: Self-pay

## 2021-09-24 ENCOUNTER — Other Ambulatory Visit: Payer: Self-pay

## 2021-09-24 ENCOUNTER — Emergency Department (HOSPITAL_COMMUNITY)
Admission: EM | Admit: 2021-09-24 | Discharge: 2021-09-24 | Disposition: A | Discharge status: Home / Self Care | Payer: Medicare HMO | Attending: Emergency Medicine | Admitting: Emergency Medicine

## 2021-09-24 DIAGNOSIS — R21 Rash and other nonspecific skin eruption: Secondary | ICD-10-CM

## 2021-09-24 DIAGNOSIS — Z79899 Other long term (current) drug therapy: Secondary | ICD-10-CM | POA: Insufficient documentation

## 2021-09-24 DIAGNOSIS — Z7982 Long term (current) use of aspirin: Secondary | ICD-10-CM | POA: Diagnosis not present

## 2021-09-24 MED ORDER — TRIAMCINOLONE ACETONIDE 0.1 % EX CREA: 1.0000 | TOPICAL_CREAM | Freq: Two times a day (BID) | CUTANEOUS | 0 refills | Status: DC

## 2021-09-24 MED ORDER — PREDNISONE 10 MG PO TABS: 20.0000 mg | ORAL_TABLET | Freq: Every day | ORAL | 0 refills | Status: DC

## 2021-09-24 NOTE — Discharge Instructions (Addendum)
Follow-up with your primary doctor on Monday for reevaluation.  Please get an appointment with dermatology for evaluation.  If you develop fevers, the rash spreads elsewhere, you have new concerning symptoms return back to the emergency department for evaluation.  Take 40 mg of steroids in the meantime daily for 5 days.  You also apply topical steroid ointment over the the rash.

## 2021-09-24 NOTE — ED Provider Notes (Signed)
[facility] EMERGENCY DEPARTMENT Provider Note   CSN: [id number] Arrival date & time: 09/24/21  1620     History  Chief Complaint  Patient presents with  . Rash    Right arm    Amber Fernandez is a 76 y.o. female.   Rash    Patient presents with rash to her right forearm.  Happened 2 weeks ago, started as a singular lesion and then spread. Denies any pain or pruritus.  Patient has some clear drainage, has not moved past the right forearm.  It started as a singular lesion and then spread.  No new skin products or changes.  No medication changes.  She was seen in urgent care, diagnosed with cellulitis and started on antibiotics would be no improvement.  The wound was cultured, it was negative.  Patient states antibiotics have not helped, she has not tried any other topical creams.  Denies any fevers, no compromise agents, new medications, paresthesias.  Home Medications Prior to Admission medications   Medication Sig Start Date End Date Taking? Authorizing Provider  amLODipine (NORVASC) 5 MG tablet TAKE 1 TABLET EVERY DAY 11/27/20   Kerri Perches, MD  aspirin 81 MG chewable tablet Chew 1 tablet (81 mg total) by mouth daily. 05/18/21   Kerri Perches, MD  atorvastatin (LIPITOR) 40 MG tablet TAKE 1 TABLET EVERY DAY 06/14/21   Kerri Perches, MD  Calcium Carbonate-Vitamin D 500-125 MG-UNIT TABS Take by mouth. 1200mg     [provider]  Multiple Vitamin (MULTIVITAMIN) tablet Take 1 tablet by mouth daily.    [provider]      Allergies    Codeine    Review of Systems   Review of Systems  Skin:  Positive for rash.    Physical Exam Updated Vital Signs BP (!) 140/70 (BP Location: Left Arm)   Pulse 80   Temp 98.3 F (36.8 C) (Oral)   Resp 16   Ht 4\' 11"  (1.499 m)   Wt 70.8 kg   SpO2 98%   BMI 31.51 kg/m  Physical Exam Vitals and nursing note reviewed. Exam conducted with a chaperone present.  Constitutional:      General: She is not in  acute distress.    Appearance: Normal appearance.  HENT:     Head: Normocephalic and atraumatic.  Eyes:     General: No scleral icterus.    Extraocular Movements: Extraocular movements intact.     Pupils: Pupils are equal, round, and reactive to light.  Cardiovascular:     Pulses: Normal pulses.  Skin:    Capillary Refill: Capillary refill takes less than 2 seconds.     Coloration: Skin is not jaundiced.     Findings: Rash present.     Comments: See photo.  Neurological:     Mental Status: She is alert. Mental status is at baseline.     Coordination: Coordination normal.       ED Results / Procedures / Treatments   Labs (all labs ordered are listed, but only abnormal results are displayed) Labs Reviewed - No data to display  EKG None  Radiology No results found.  Procedures Procedures    Medications Ordered in ED Medications - No data to display  ED Course/ Medical Decision Making/ A&P                           Medical Decision Making  Patient with medical history of hypertension presents  today due to rash.  Reviewed external records, negative biopsy at urgent care and failed trial of outpatient antibiotics.  Its not pruritic does not appear like hives.  Not erythematous, no appreciable fluctuance so I do not think this is cellulitis or an abscess.  Does not appear petechial.  She has not been on any recent medications, there is no skin sloughing that be suggestive of SJS or TE N.  She is neurovascular intact with brisk cap refill and radial pulses 2+.  Is nontender.  We will do trial of steroids given patient is not immunocompromised and have her follow-up with her PCP.  Dermatology referral also provided.         Final Clinical Impression(s) / ED Diagnoses Final diagnoses:  None    Rx / DC Orders ED Discharge Orders     None         Theron Arista, Cordelia Poche 09/24/21 2203    Vanetta Mulders, MD 09/25/21 1718

## 2021-09-24 NOTE — ED Provider Triage Note (Signed)
Emergency Medicine Provider Triage Evaluation Note  Amber Fernandez , a 76 y.o. female  was evaluated in triage.  Pt complains of rash to her right forearm for the past 3 weeks. Denies any pain or itching. Denies any other symptoms. She has been to urgent care for this several times and has been on antibiotics without help. .  Review of Systems  Positive:  Negative:   Physical Exam  BP (!) 148/82 (BP Location: Left Arm)   Pulse 90   Temp 98.3 F (36.8 C) (Oral)   Resp 18   Ht 4\' 11"  (1.499 m)   Wt 70.8 kg   SpO2 98%   BMI 31.51 kg/m  Gen:   Awake, no distress   Resp:  Normal effort  MSK:   Moves extremities without difficulty  Other:         Medical Decision Making  Medically screening exam initiated at 5:27 PM.  Appropriate orders placed.  Amber Fernandez was informed that the remainder of the evaluation will be completed by another provider, this initial triage assessment does not replace that evaluation, and the importance of remaining in the ED until their evaluation is complete.    Diana Eves, PA-C 09/24/21 1728

## 2021-09-24 NOTE — ED Notes (Signed)
Dressing applied to right wrist

## 2021-09-24 NOTE — ED Triage Notes (Signed)
Pt to ED via POV C/O Rash to right arm x 2 weeks, evaluated at urgent care and started on abx, currently taking doxy. Told by urgent care cultures negative.

## 2021-09-29 ENCOUNTER — Ambulatory Visit (INDEPENDENT_AMBULATORY_CARE_PROVIDER_SITE_OTHER): Payer: Medicare HMO | Admitting: Family Medicine

## 2021-09-29 ENCOUNTER — Encounter: Payer: Self-pay | Admitting: Family Medicine

## 2021-09-29 VITALS — BP 150/90 | HR 74 | Resp 16 | Ht 59.0 in | Wt 156.0 lb

## 2021-09-29 DIAGNOSIS — E663 Overweight: Secondary | ICD-10-CM

## 2021-09-29 DIAGNOSIS — I10 Essential (primary) hypertension: Secondary | ICD-10-CM | POA: Diagnosis not present

## 2021-09-29 DIAGNOSIS — R21 Rash and other nonspecific skin eruption: Secondary | ICD-10-CM

## 2021-09-29 DIAGNOSIS — E785 Hyperlipidemia, unspecified: Secondary | ICD-10-CM | POA: Diagnosis not present

## 2021-09-29 DIAGNOSIS — R7303 Prediabetes: Secondary | ICD-10-CM

## 2021-09-29 NOTE — Patient Instructions (Signed)
Keep appointment in November as before  Please call and let us know what you are taking for blood pressure , I am concerned about it   You are referred to Dermatology, Dr Nevada Crane, they will call with appointment   Fasting lipid, cmp and eGFR and HBa1C 3 to 5 days before next visit  It is important that you exercise regularly at least 30 minutes 5 times a week. If you develop chest pain, have severe difficulty breathing, or feel very tired, stop exercising immediately and seek medical attention   Thanks for choosing [facility], we consider it a privelige to serve you.

## 2021-09-29 NOTE — Progress Notes (Signed)
Amber Fernandez     MRN: [id number]      DOB: [birth year]   HPI Amber Fernandez is here for follow up and re-evaluation of chronic medical conditions, medication management and review of any available recent lab and radiology data.  Preventive health is updated, specifically  Cancer screening and Immunization.   Questions or concerns regarding consultations or procedures which the PT has had in the interim are  addressed. The PT denies any adverse reactions to current medications since the last visit.  Intermittent rash on right forearm x 2 months, ahs been in [facility] and ED several times continues torecurr, at times pruritic  ROS Denies recent fever or chills. Denies sinus pressure, nasal congestion, ear pain or sore throat. Denies chest congestion, productive cough or wheezing. Denies chest pains, palpitations and leg swelling Denies abdominal pain, nausea, vomiting,diarrhea or constipation.   Denies dysuria, frequency, hesitancy or incontinence. Denies joint pain, swelling and limitation in mobility. Denies headaches, seizures, numbness, or tingling. Denies depression, anxiety or insomnia.   PE  BP (!) 150/90   Pulse 74   Resp 16   Ht 4\' 11"  (1.499 m)   Wt 156 lb (70.8 kg)   SpO2 96%   BMI 31.51 kg/m   Patient alert and oriented and in no cardiopulmonary distress.  HEENT: No facial asymmetry, EOMI,     Neck supple .  Chest: Clear to auscultation bilaterally.  CVS: S1, S2 no murmurs, no S3.Regular rate.  ABD: Soft non tender.   Ext: No edema  MS: Adequate ROM spine, shoulders, hips and knees.  Skin: hyperpigmented maciulopapular rash Psych: Good eye contact, normal affect. Memory intact not anxious or depressed appearing.  CNS: CN 2-12 intact, power,  normal throughout.no focal deficits noted.   Assessment & Plan  Rash Recurrent hyperpigmented rash on right forearm x  3 months,refer Dermatology  Essential hypertension Elevated at visit, states her bP is normal at  home, will hold on med change DASH diet and commitment to daily physical activity for a minimum of 30 minutes discussed and encouraged, as a part of hypertension management. The importance of attaining a healthy weight is also discussed.     09/29/2021    8:28 AM 09/29/2021    8:17 AM 09/29/2021    8:14 AM 09/24/2021    9:25 PM 09/24/2021    7:48 PM 09/24/2021    4:37 PM 07/05/2021    8:10 AM  BP/Weight  Systolic BP 150 150 155 140 140 148 122  Diastolic BP 90 82 78 72 70 82 76  Wt. (Lbs)   156   156   BMI   31.51 kg/m2   31.51 kg/m2        Overweight (BMI 25.0-29.9)  Patient re-educated about  the importance of commitment to a  minimum of 150 minutes of exercise per week as able.  The importance of healthy food choices with portion control discussed, as well as eating regularly and within a 12 hour window most days. The need to choose "clean , green" food 50 to 75% of the time is discussed, as well as to make water the primary drink and set a goal of 64 ounces water daily.       09/29/2021    8:14 AM 09/24/2021    4:37 PM 06/23/2021    8:16 AM  Weight /BMI  Weight 156 lb 156 lb 156 lb  Height 4\' 11"  (1.499 m) 4\' 11"  (1.499 m) 4\' 11"  (1.499 m)  BMI 31.51 kg/m2 31.51 kg/m2 31.51 kg/m2

## 2021-09-29 NOTE — Assessment & Plan Note (Signed)
Elevated at visit, states her bP is normal at home, will hold on med change DASH diet and commitment to daily physical activity for a minimum of 30 minutes discussed and encouraged, as a part of hypertension management. The importance of attaining a healthy weight is also discussed.     09/29/2021    8:28 AM 09/29/2021    8:17 AM 09/29/2021    8:14 AM 09/24/2021    9:25 PM 09/24/2021    7:48 PM 09/24/2021    4:37 PM 07/05/2021    8:10 AM  BP/Weight  Systolic BP 150 150 155 140 140 148 122  Diastolic BP 90 82 78 72 70 82 76  Wt. (Lbs)   156   156   BMI   31.51 kg/m2   31.51 kg/m2

## 2021-09-29 NOTE — Assessment & Plan Note (Signed)
Recurrent hyperpigmented rash on right forearm x  3 months,refer Dermatology

## 2021-09-29 NOTE — Assessment & Plan Note (Signed)
  Patient re-educated about  the importance of commitment to a  minimum of 150 minutes of exercise per week as able.  The importance of healthy food choices with portion control discussed, as well as eating regularly and within a 12 hour window most days. The need to choose "clean , green" food 50 to 75% of the time is discussed, as well as to make water the primary drink and set a goal of 64 ounces water daily.       09/29/2021    8:14 AM 09/24/2021    4:37 PM 06/23/2021    8:16 AM  Weight /BMI  Weight 156 lb 156 lb 156 lb  Height 4\' 11"  (1.499 m) 4\' 11"  (1.499 m) 4\' 11"  (1.499 m)  BMI 31.51 kg/m2 31.51 kg/m2 31.51 kg/m2

## 2021-09-30 ENCOUNTER — Telehealth: Payer: Self-pay

## 2021-09-30 NOTE — Telephone Encounter (Signed)
FYI

## 2021-09-30 NOTE — Telephone Encounter (Signed)
Patient called said Dr Lodema Hong asked her to call our office and give her her blood pressure readings: 08.30.2023 at 7:00 pm  127/71 08.31.2023 at 4:00 am 129/71

## 2021-11-02 ENCOUNTER — Telehealth: Payer: Self-pay

## 2021-11-02 DIAGNOSIS — L308 Other specified dermatitis: Secondary | ICD-10-CM | POA: Diagnosis not present

## 2021-11-02 DIAGNOSIS — S50861A Insect bite (nonvenomous) of right forearm, initial encounter: Secondary | ICD-10-CM | POA: Diagnosis not present

## 2021-11-02 DIAGNOSIS — L0102 Bockhart's impetigo: Secondary | ICD-10-CM | POA: Diagnosis not present

## 2021-11-02 NOTE — Telephone Encounter (Signed)
Patient came by the office to let Dr Moshe Cipro know the medicine that she was taking.  Amlodipine 5 mg takes qd tablet a day  Atorvastatin 40 mg takes qd tablet a day

## 2021-11-02 NOTE — Telephone Encounter (Signed)
These are correct per her med list

## 2021-11-03 ENCOUNTER — Other Ambulatory Visit: Payer: Self-pay

## 2021-11-03 MED ORDER — AMLODIPINE BESYLATE 2.5 MG PO TABS: 2.5000 mg | ORAL_TABLET | Freq: Every day | ORAL | 1 refills | Status: DC

## 2021-11-03 NOTE — Telephone Encounter (Signed)
Called pt on her cell [phone] and [facility]

## 2021-11-03 NOTE — Telephone Encounter (Signed)
Pt aware med sent 

## 2021-11-22 DIAGNOSIS — R7303 Prediabetes: Secondary | ICD-10-CM | POA: Diagnosis not present

## 2021-11-22 DIAGNOSIS — E785 Hyperlipidemia, unspecified: Secondary | ICD-10-CM | POA: Diagnosis not present

## 2021-11-22 DIAGNOSIS — I10 Essential (primary) hypertension: Secondary | ICD-10-CM | POA: Diagnosis not present

## 2021-11-23 LAB — CMP14+EGFR
ALT: 18 IU/L (ref 0–32)
AST: 25 IU/L (ref 0–40)
Albumin/Globulin Ratio: 1.5 (ref 1.2–2.2)
Albumin: 4.3 g/dL (ref 3.8–4.8)
Alkaline Phosphatase: 87 IU/L (ref 44–121)
BUN/Creatinine Ratio: 14 (ref 12–28)
BUN: 13 mg/dL (ref 8–27)
Bilirubin Total: 0.5 mg/dL (ref 0.0–1.2)
CO2: 22 mmol/L (ref 20–29)
Calcium: 9.5 mg/dL (ref 8.7–10.3)
Chloride: 107 mmol/L — ABNORMAL HIGH (ref 96–106)
Creatinine, Ser: 0.91 mg/dL (ref 0.57–1.00)
Globulin, Total: 2.9 g/dL (ref 1.5–4.5)
Glucose: 96 mg/dL (ref 70–99)
Potassium: 4.4 mmol/L (ref 3.5–5.2)
Sodium: 143 mmol/L (ref 134–144)
Total Protein: 7.2 g/dL (ref 6.0–8.5)
eGFR: 66 mL/min/{1.73_m2} (ref >59)

## 2021-11-23 LAB — HEMOGLOBIN A1C
Est. average glucose Bld gHb Est-mCnc: 134 mg/dL
Hgb A1c MFr Bld: 6.3 % — ABNORMAL HIGH (ref 4.8–5.6)

## 2021-11-23 LAB — LIPID PANEL
Chol/HDL Ratio: 1.9 ratio (ref 0.0–4.4)
Cholesterol, Total: 134 mg/dL (ref 100–199)
HDL: 71 mg/dL (ref >39)
LDL Chol Calc (NIH): 51 mg/dL (ref 0–99)
Triglycerides: 51 mg/dL (ref 0–149)
VLDL Cholesterol Cal: 12 mg/dL (ref 5–40)

## 2021-12-01 ENCOUNTER — Encounter: Payer: Self-pay | Admitting: Family Medicine

## 2021-12-01 ENCOUNTER — Ambulatory Visit (INDEPENDENT_AMBULATORY_CARE_PROVIDER_SITE_OTHER): Payer: Medicare HMO | Admitting: Family Medicine

## 2021-12-01 VITALS — BP 124/74 | HR 74 | Ht 59.0 in | Wt 157.1 lb

## 2021-12-01 DIAGNOSIS — Z1231 Encounter for screening mammogram for malignant neoplasm of breast: Secondary | ICD-10-CM

## 2021-12-01 DIAGNOSIS — E785 Hyperlipidemia, unspecified: Secondary | ICD-10-CM

## 2021-12-01 DIAGNOSIS — E559 Vitamin D deficiency, unspecified: Secondary | ICD-10-CM

## 2021-12-01 DIAGNOSIS — Z0000 Encounter for general adult medical examination without abnormal findings: Secondary | ICD-10-CM

## 2021-12-01 DIAGNOSIS — I10 Essential (primary) hypertension: Secondary | ICD-10-CM

## 2021-12-01 DIAGNOSIS — Z23 Encounter for immunization: Secondary | ICD-10-CM | POA: Diagnosis not present

## 2021-12-01 DIAGNOSIS — R7303 Prediabetes: Secondary | ICD-10-CM

## 2021-12-01 MED ORDER — ASPIRIN 81 MG PO TBEC: 81.0000 mg | DELAYED_RELEASE_TABLET | Freq: Every day | ORAL | 3 refills | Status: AC

## 2021-12-01 MED ORDER — AMLODIPINE BESYLATE 5 MG PO TABS: 5.0000 mg | ORAL_TABLET | Freq: Every day | ORAL | 3 refills | Status: DC

## 2021-12-01 NOTE — Assessment & Plan Note (Signed)

## 2021-12-01 NOTE — Progress Notes (Signed)
    Amber Fernandez     MRN: [id number]      DOB: 20-Apr-1945  HPI: Patient is in for annual physical exam. No other health concerns are expressed or addressed at the visit. Recent labs,  are reviewed. Immunization is reviewed , and  updated if needed.   PE: BP 124/74 (BP Location: Right Arm, Patient Position: Sitting, Cuff Size: Large)   Pulse 74   Ht 4\' 11"  (1.499 m)   Wt 157 lb 1.3 oz (71.3 kg)   SpO2 97%   BMI 31.73 kg/m   Pleasant  female, alert and oriented x 3, in no cardio-pulmonary distress. Afebrile. HEENT No facial trauma or asymetry. Sinuses non tender.  Extra occullar muscles intact.. External ears normal, . Neck: supple, no adenopathy,JVD or thyromegaly.No bruits.  Chest: Clear to ascultation bilaterally.No crackles or wheezes. Non tender to palpation    Cardiovascular system; Heart sounds normal,  S1 and  S2 ,no S3.  No murmur, or thrill. Apical beat not displaced Peripheral pulses normal.  Abdomen: Soft, non tender, no organomegaly or masses. No bruits. Bowel sounds normal. No guarding, tenderness or rebound.    Musculoskeletal exam: Full ROM of spine, hips , shoulders and knees.  deformity ,swelling or crepitus noted.in right knee No muscle wasting or atrophy.   Neurologic: Cranial nerves 2 to 12 intact. Power, tone ,sensation and reflexes normal throughout.  disturbance in gait. No tremor.  Skin: Intact, no ulceration, hyperpigmented  rash noted.on right forearm Psych; Normal mood and affect. Judgement and concentration normal   Assessment & Plan:  Encounter for annual health examination Annual exam as documented. Counseling done  re healthy lifestyle involving commitment to 150 minutes exercise per week, heart healthy diet, and attaining healthy weight.The importance of adequate sleep also discussed. Regular seat belt use and home safety, is also discussed. Changes in health habits are decided on by the patient with goals and time  frames  set for achieving them. Immunization and cancer screening needs are specifically addressed at this visit.

## 2021-12-01 NOTE — Patient Instructions (Addendum)
F/u in 6 months, call if you need me sooner  Flu vaccine today  No med changes.  Resume aspirin 81 mg daily  Keep up great health habits  Excellent labs, no med changes, reduce sweets and white foods  Fasting CBC, lipid , cmp and EGFr, CBC, TSH and vit D and HBA1C 1 week before follow up   Recommend RSV vaccine and Covid vaccine, both are at your pharmacy  It is important that you exercise regularly at least 30 minutes 5 times a week. If you develop chest pain, have severe difficulty breathing, or feel very tired, stop exercising immediately and seek medical attention   Thanks for choosing [facility], we consider it a privelige to serve you.   BEST wishes for Season and 2024

## 2022-01-11 ENCOUNTER — Telehealth: Payer: Self-pay | Admitting: Family Medicine

## 2022-01-11 ENCOUNTER — Other Ambulatory Visit: Payer: Self-pay

## 2022-01-11 MED ORDER — TRIAMCINOLONE ACETONIDE 0.1 % EX CREA: 1.0000 | TOPICAL_CREAM | Freq: Two times a day (BID) | CUTANEOUS | 0 refills | Status: DC

## 2022-01-11 NOTE — Telephone Encounter (Signed)
Patient called and need refill on cream for her itching, patient does not know the name of cream.   Pharmacy: [facility]

## 2022-01-11 NOTE — Telephone Encounter (Signed)
Refills sent

## 2022-01-14 ENCOUNTER — Telehealth: Payer: Self-pay | Admitting: Family Medicine

## 2022-01-14 ENCOUNTER — Other Ambulatory Visit: Payer: Self-pay

## 2022-01-14 DIAGNOSIS — L299 Pruritus, unspecified: Secondary | ICD-10-CM

## 2022-01-14 MED ORDER — CLOTRIMAZOLE-BETAMETHASONE 1-0.05 % EX CREA: 1.0000 | TOPICAL_CREAM | Freq: Two times a day (BID) | CUTANEOUS | 0 refills | Status: DC

## 2022-01-14 NOTE — Telephone Encounter (Signed)
Pt called stating the wrong cream was sent in. Wants to know if you can please send in clotrimazole-betamethasone (LOTRISONE) cream .

## 2022-01-14 NOTE — Telephone Encounter (Signed)
Refills sent

## 2022-01-26 ENCOUNTER — Ambulatory Visit (HOSPITAL_COMMUNITY)
Admission: RE | Admit: 2022-01-26 | Discharge: 2022-01-26 | Disposition: A | Discharge status: Home / Self Care | Payer: Medicare HMO | Source: Ambulatory Visit | Attending: Family Medicine | Admitting: Family Medicine

## 2022-01-26 DIAGNOSIS — Z1231 Encounter for screening mammogram for malignant neoplasm of breast: Secondary | ICD-10-CM | POA: Insufficient documentation

## 2022-03-02 ENCOUNTER — Other Ambulatory Visit: Payer: Self-pay

## 2022-03-02 DIAGNOSIS — L299 Pruritus, unspecified: Secondary | ICD-10-CM

## 2022-03-02 MED ORDER — CLOTRIMAZOLE-BETAMETHASONE 1-0.05 % EX CREA: 1.0000 | TOPICAL_CREAM | Freq: Two times a day (BID) | CUTANEOUS | 0 refills | Status: AC

## 2022-03-08 DIAGNOSIS — Z8673 Personal history of transient ischemic attack (TIA), and cerebral infarction without residual deficits: Secondary | ICD-10-CM | POA: Diagnosis not present

## 2022-03-08 DIAGNOSIS — R7303 Prediabetes: Secondary | ICD-10-CM | POA: Diagnosis not present

## 2022-03-08 DIAGNOSIS — E782 Mixed hyperlipidemia: Secondary | ICD-10-CM | POA: Diagnosis not present

## 2022-03-08 DIAGNOSIS — Z1330 Encounter for screening examination for mental health and behavioral disorders, unspecified: Secondary | ICD-10-CM | POA: Diagnosis not present

## 2022-03-08 DIAGNOSIS — R6889 Other general symptoms and signs: Secondary | ICD-10-CM | POA: Diagnosis not present

## 2022-03-08 DIAGNOSIS — L299 Pruritus, unspecified: Secondary | ICD-10-CM | POA: Diagnosis not present

## 2022-03-08 DIAGNOSIS — I10 Essential (primary) hypertension: Secondary | ICD-10-CM | POA: Diagnosis not present

## 2022-03-29 IMAGING — DX DG CHEST 2V
2 series · 2 of 2 positions shown · non-contrast
Comparison: Radiographs 03/14/2020.  CT 02/23/2020.

CLINICAL DATA: Follow up pneumonia. History of G7C69-UX infection
in [REDACTED].

EXAM:
CHEST - 2 VIEW

[chest pa]
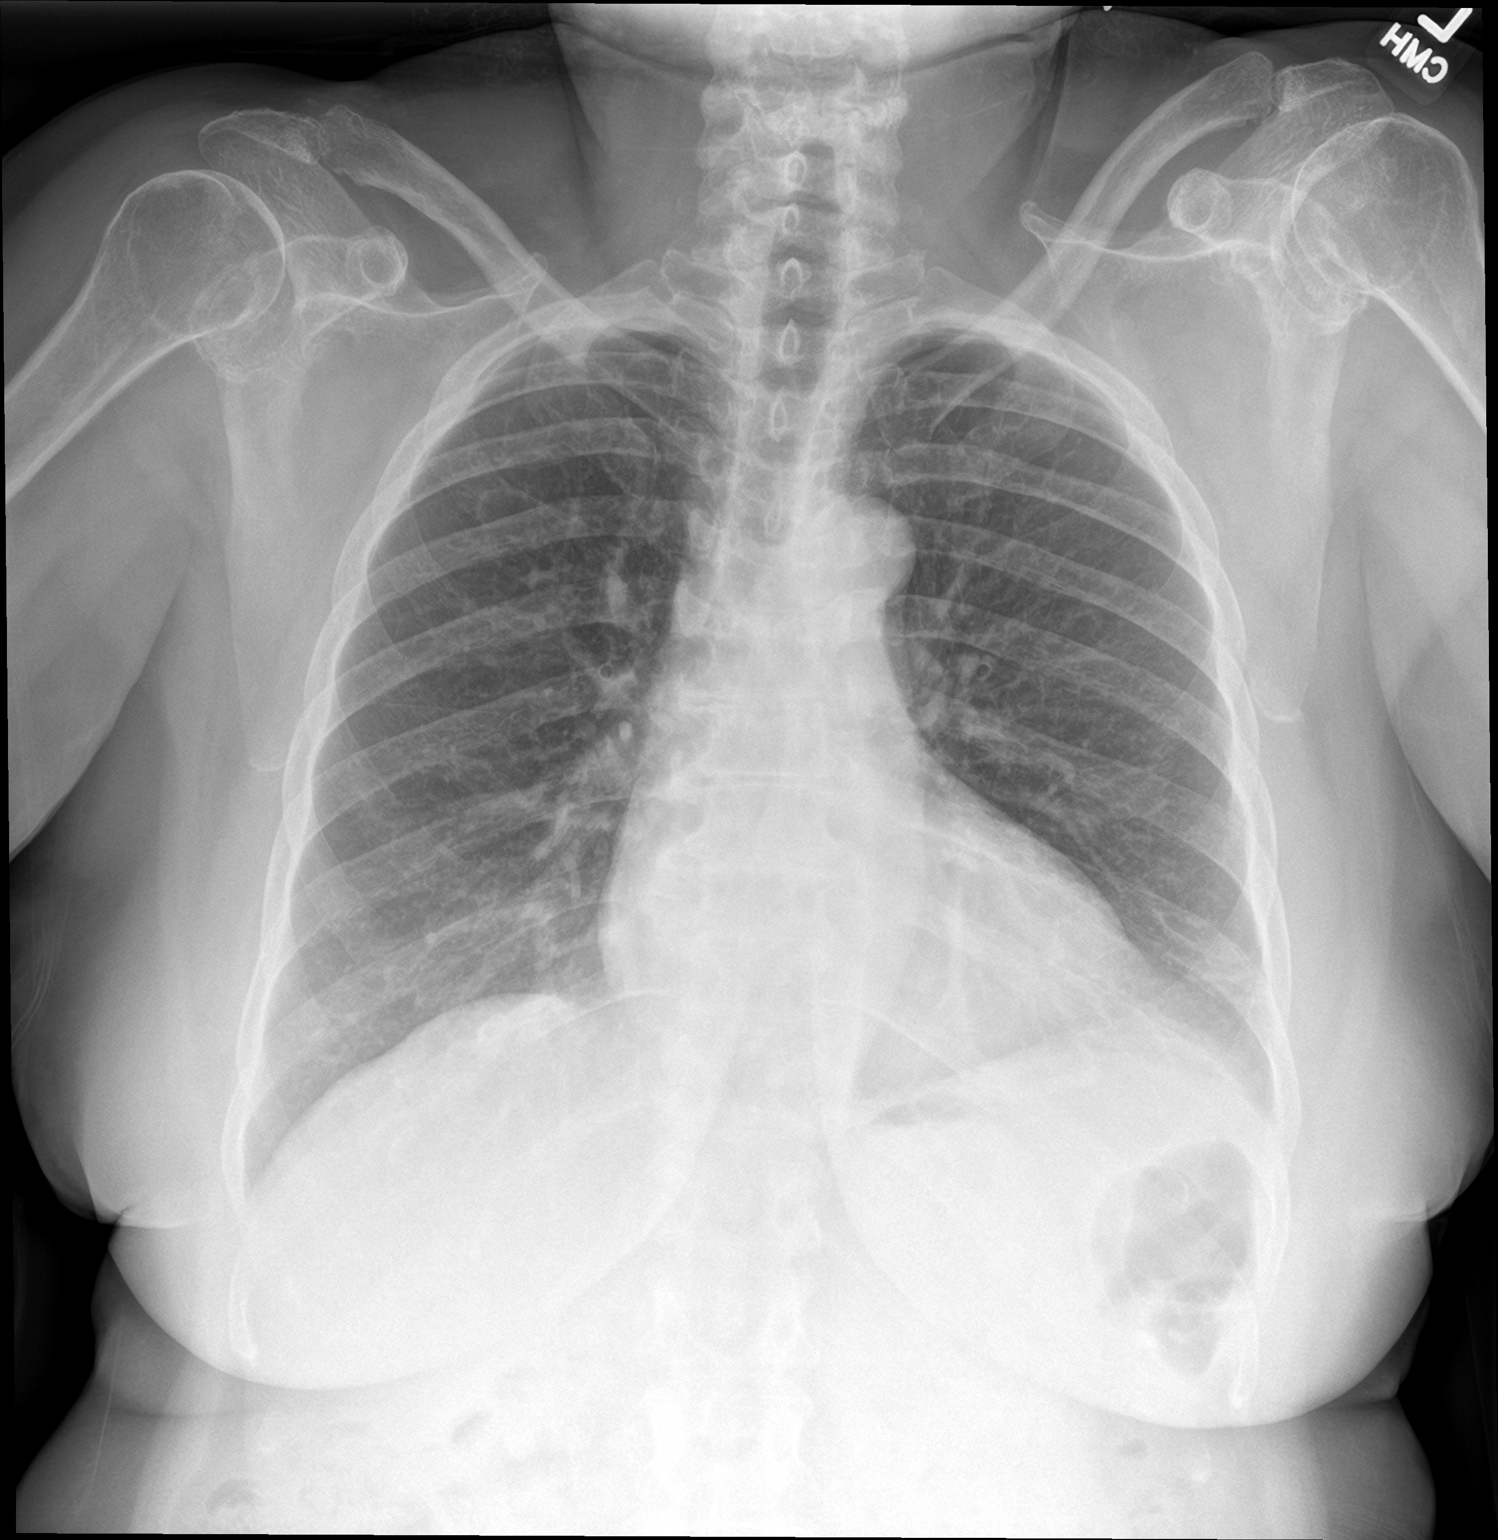

[chest lat]
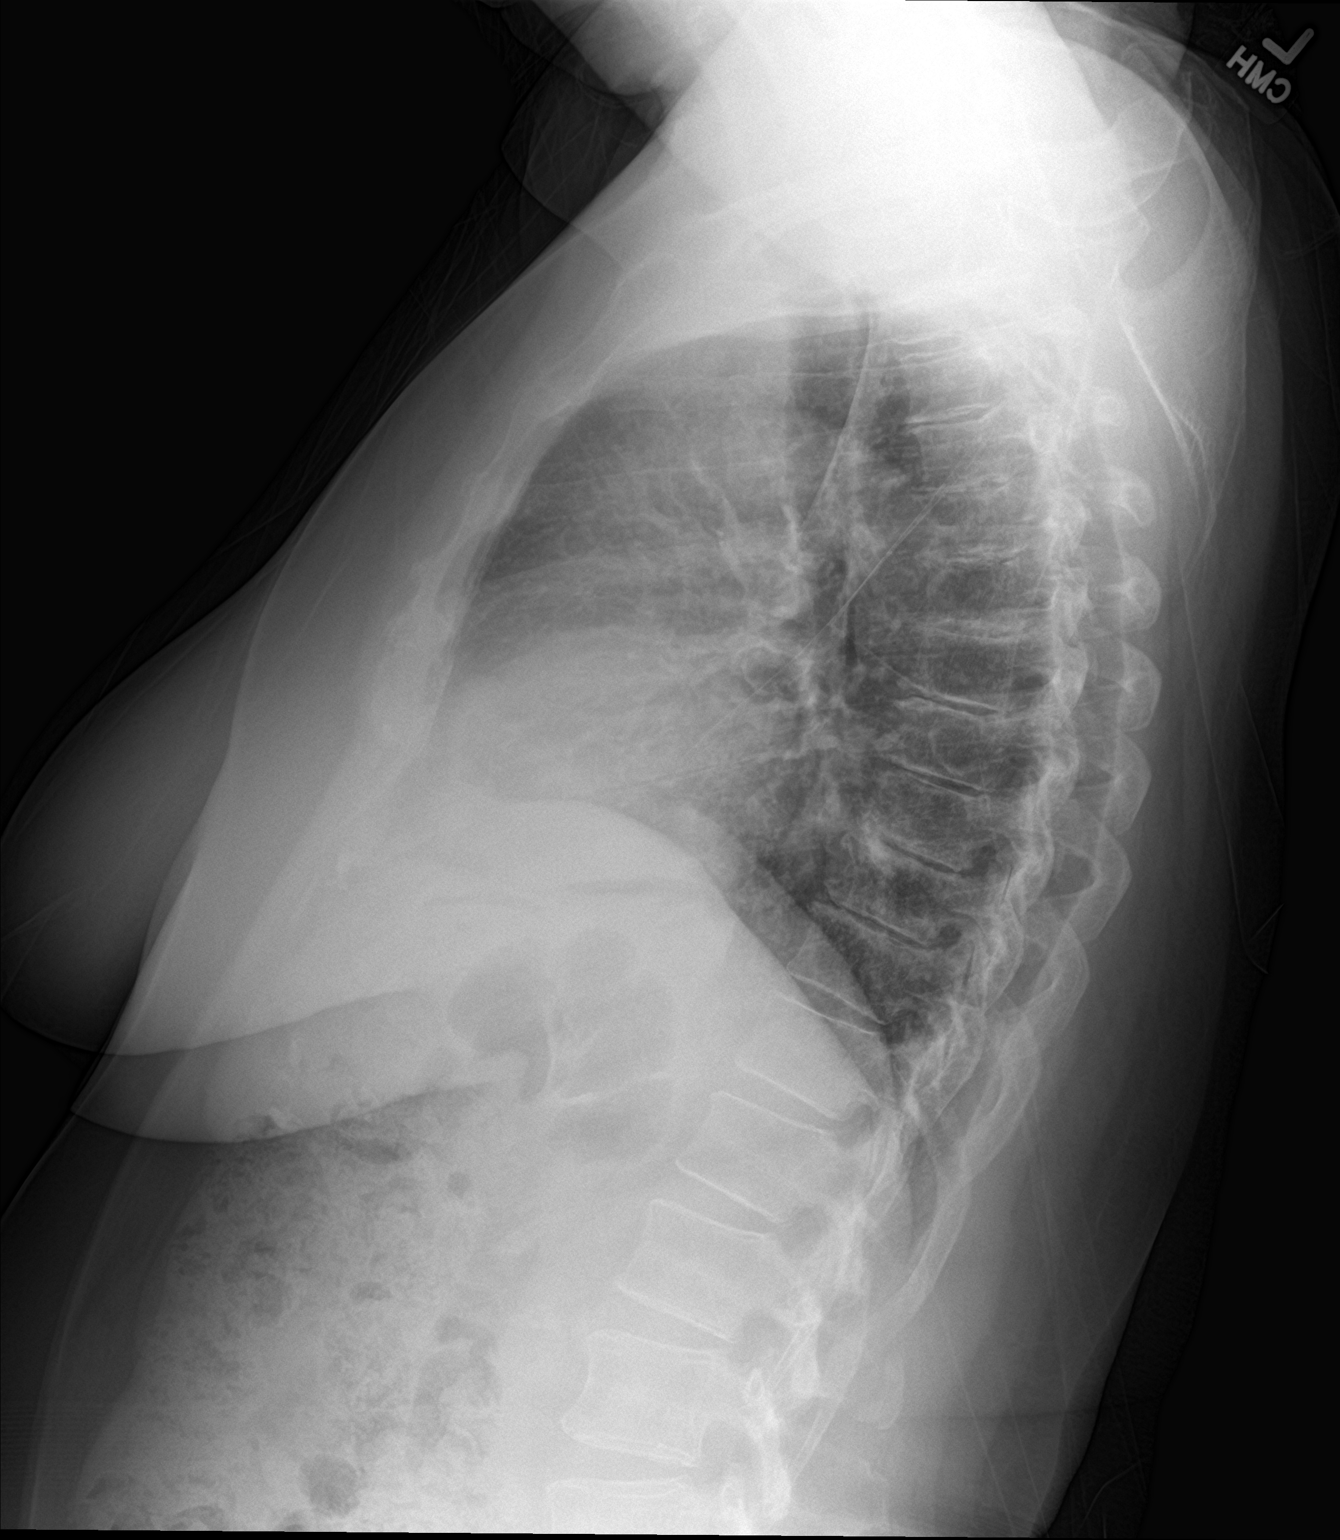

[2 of 2 positions shown; findings below may reference images not displayed]

FINDINGS: The heart size and mediastinal contours are stable. There is
interval improved aeration of the lung bases with near complete
resolution of the patchy ground-glass opacities demonstrated
previously. There may be minimal residual left basilar atelectasis
or scarring. No consolidation, edema, pleural effusion or
pneumothorax. Mild degenerative changes are present in the thoracic
spine.
IMPRESSION: Near complete resolution of previously demonstrated pulmonary
opacities. No new findings.

## 2022-04-15 ENCOUNTER — Other Ambulatory Visit: Payer: Self-pay | Admitting: Family Medicine

## 2022-06-01 ENCOUNTER — Ambulatory Visit: Payer: Medicare HMO | Admitting: Family Medicine

## 2022-07-14 DIAGNOSIS — E782 Mixed hyperlipidemia: Secondary | ICD-10-CM | POA: Diagnosis not present

## 2022-07-14 DIAGNOSIS — Z0000 Encounter for general adult medical examination without abnormal findings: Secondary | ICD-10-CM | POA: Diagnosis not present

## 2022-07-14 DIAGNOSIS — I10 Essential (primary) hypertension: Secondary | ICD-10-CM | POA: Diagnosis not present

## 2022-07-14 DIAGNOSIS — G8929 Other chronic pain: Secondary | ICD-10-CM | POA: Diagnosis not present

## 2022-07-14 DIAGNOSIS — M25511 Pain in right shoulder: Secondary | ICD-10-CM | POA: Diagnosis not present

## 2022-07-14 DIAGNOSIS — R6889 Other general symptoms and signs: Secondary | ICD-10-CM | POA: Diagnosis not present

## 2022-07-14 DIAGNOSIS — R7303 Prediabetes: Secondary | ICD-10-CM | POA: Diagnosis not present

## 2022-08-24 DIAGNOSIS — K5900 Constipation, unspecified: Secondary | ICD-10-CM | POA: Diagnosis not present

## 2022-08-24 DIAGNOSIS — I878 Other specified disorders of veins: Secondary | ICD-10-CM | POA: Diagnosis not present

## 2022-08-24 DIAGNOSIS — R109 Unspecified abdominal pain: Secondary | ICD-10-CM | POA: Diagnosis not present

## 2022-12-16 DIAGNOSIS — H524 Presbyopia: Secondary | ICD-10-CM | POA: Diagnosis not present

## 2023-03-10 ENCOUNTER — Encounter (HOSPITAL_COMMUNITY): Payer: Self-pay | Admitting: Physician Assistant

## 2023-03-29 DIAGNOSIS — K5900 Constipation, unspecified: Secondary | ICD-10-CM | POA: Diagnosis not present

## 2023-04-03 ENCOUNTER — Encounter: Payer: Self-pay | Admitting: Gastroenterology

## 2023-04-06 ENCOUNTER — Encounter: Payer: Self-pay | Admitting: *Deleted
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
## 2023-04-24 ENCOUNTER — Ambulatory Visit (INDEPENDENT_AMBULATORY_CARE_PROVIDER_SITE_OTHER): Admitting: Gastroenterology

## 2023-04-24 ENCOUNTER — Encounter: Payer: Self-pay | Admitting: Gastroenterology

## 2023-04-24 VITALS — BP 135/78 | HR 99 | Temp 97.9°F | Ht 59.0 in | Wt 153.0 lb

## 2023-04-24 DIAGNOSIS — Z1211 Encounter for screening for malignant neoplasm of colon: Secondary | ICD-10-CM

## 2023-04-24 DIAGNOSIS — K5900 Constipation, unspecified: Secondary | ICD-10-CM | POA: Diagnosis not present

## 2023-04-24 NOTE — Patient Instructions (Signed)
 Stop MiraLAX and all other over-the-counter agents for constipation.  Linzess 145 mcg daily, first thing in the morning on an empty stomach, at least 30 minutes before breakfast.  We are providing you with samples today.  Please call next week with a progress report.  If this works well, I will send a prescription to your pharmacy.  I will plan to see you back in the office in 8 weeks.  Hopefully, constipation will be improved and we can discuss scheduling a colonoscopy.  It was nice to meet you today!  Ermalinda Memos, PA-C [facility] Gastroenterology

## 2023-05-04 ENCOUNTER — Telehealth: Payer: Self-pay

## 2023-05-04 NOTE — Telephone Encounter (Signed)
 Agree with recommendations.

## 2023-05-04 NOTE — Telephone Encounter (Signed)
 Pt called stating that linzess 145 helped but not like it should. Told pt she could come by the office and pick up samples for 290 and see if they help better. Pt verbalized understanding.

## 2023-05-22 ENCOUNTER — Other Ambulatory Visit: Payer: Self-pay | Admitting: Gastroenterology

## 2023-05-22 DIAGNOSIS — K5900 Constipation, unspecified: Secondary | ICD-10-CM

## 2023-05-22 MED ORDER — LINACLOTIDE 290 MCG PO CAPS: 290.0000 ug | ORAL_CAPSULE | Freq: Every day | ORAL | 5 refills | Status: DC

## 2023-05-22 NOTE — Telephone Encounter (Signed)
 Pt phoned back and she does want Rx for Lin[facility] [place] Please advise

## 2023-05-22 NOTE — Telephone Encounter (Signed)
 Rx sent

## 2023-05-22 NOTE — Telephone Encounter (Signed)
 Phoned and advised the pt that her Rx has been sent to her pharmacy. Pt expressed understanding

## 2023-05-23 DIAGNOSIS — E785 Hyperlipidemia, unspecified: Secondary | ICD-10-CM | POA: Diagnosis not present

## 2023-05-23 DIAGNOSIS — I10 Essential (primary) hypertension: Secondary | ICD-10-CM | POA: Diagnosis not present

## 2023-05-23 DIAGNOSIS — K5900 Constipation, unspecified: Secondary | ICD-10-CM | POA: Diagnosis not present

## 2023-05-23 DIAGNOSIS — I639 Cerebral infarction, unspecified: Secondary | ICD-10-CM | POA: Diagnosis not present

## 2023-05-29 DIAGNOSIS — R1084 Generalized abdominal pain: Secondary | ICD-10-CM | POA: Diagnosis not present

## 2023-05-29 DIAGNOSIS — R109 Unspecified abdominal pain: Secondary | ICD-10-CM | POA: Diagnosis not present

## 2023-05-29 DIAGNOSIS — K5900 Constipation, unspecified: Secondary | ICD-10-CM | POA: Diagnosis not present

## 2023-05-29 DIAGNOSIS — I878 Other specified disorders of veins: Secondary | ICD-10-CM | POA: Diagnosis not present

## 2023-05-31 ENCOUNTER — Telehealth: Payer: Self-pay | Admitting: *Deleted

## 2023-05-31 NOTE — Telephone Encounter (Signed)
 Pt called and stated that she went to urgent care on Monday because of stomach pains and bloating. She stated they did an X-Ray and told her that her colon was full. They told her to take citrus magnesium. She stated that it only worked for a day and she still feels bloated. She states she is taking Linzess  290mcg daily. She went twice yesterday and once today.

## 2023-06-01 NOTE — Telephone Encounter (Signed)
 Recommend completing MiraLAX bowel prep, then resume Linzess  290 mcg daily.   She will need to write down the instructions for the prep or come by the office for a print out since she is not on mychart. As it is quite a bit to write down would be best if she comes by to pick up instructions.   MIRALAX PREP INSTRUCTIONS  Purchase:  MIRALAX 238 gram bottle, 1 box of DULCOLAX   Day of prep:  CLEAR LIQUIDS ALL DAY--NO SOLID FOODS.  At 10:00 AM, take 2 DULCOLAX 5mg  tablets  At 12:00 PM, Mix 5 teaspoons of Miralax in any 4-6 ounces of CLEAR LIQUIDS (Gatorade) every hour for 5 hours until passing watery to clear stool. Be sure to drink 4 ounces of clear liquid 30 minutes after each dose of Miralax.   At 3:00 PM, take 2 Dulcolax 5mg  tablets  If you are not having frequent watery bowel movements by 6:00 PM, take 5 teaspoons of Miralax every 30 minutes until stools become watery to clear.    After completing MiraLAX Prep, resume Linzess  290 mcg the following day and continue to take each morning.

## 2023-06-01 NOTE — Telephone Encounter (Signed)
 LMOM for pt to call office

## 2023-06-02 NOTE — Telephone Encounter (Signed)
 Spoke to pt, informed her of recommendations. She voiced understanding. She states she wrote it down and would ask pharmacy if she had any questions.

## 2023-06-14 ENCOUNTER — Encounter: Payer: Self-pay | Admitting: *Deleted
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
## 2023-06-19 ENCOUNTER — Ambulatory Visit: Admitting: Gastroenterology

## 2023-06-26 ENCOUNTER — Other Ambulatory Visit: Payer: Self-pay | Admitting: Family Medicine

## 2023-06-29 DIAGNOSIS — H524 Presbyopia: Secondary | ICD-10-CM | POA: Diagnosis not present

## 2023-06-30 ENCOUNTER — Telehealth: Payer: Self-pay | Admitting: *Deleted

## 2023-06-30 NOTE — Telephone Encounter (Signed)
 Noted

## 2023-06-30 NOTE — Telephone Encounter (Signed)
 Pt called and needs sample of Linzess  290mg c. I left some at front desk also left pt assistance forms for her to fill out.

## 2023-07-18 ENCOUNTER — Ambulatory Visit (INDEPENDENT_AMBULATORY_CARE_PROVIDER_SITE_OTHER): Payer: Medicare HMO

## 2023-07-18 VITALS — BP 127/77 | Ht 59.0 in | Wt 150.0 lb

## 2023-07-18 DIAGNOSIS — Z780 Asymptomatic menopausal state: Secondary | ICD-10-CM

## 2023-07-18 DIAGNOSIS — Z0000 Encounter for general adult medical examination without abnormal findings: Secondary | ICD-10-CM

## 2023-07-18 DIAGNOSIS — Z1231 Encounter for screening mammogram for malignant neoplasm of breast: Secondary | ICD-10-CM

## 2023-07-18 NOTE — Patient Instructions (Signed)
 [name] , Thank you for taking time out of your busy schedule to complete your Annual Wellness Visit with me. I enjoyed our conversation and look forward to speaking with you again next year. I, as well as your care team,  appreciate your ongoing commitment to your health goals. Please review the following plan we discussed and let me know if I can assist you in the future. Your Game plan/ To Do List    Referrals: If you haven't heard from the office you've been referred to, please reach out to them at the phone provided.  An order for a bone density scan was placed for you today Please call the number below to schedule your appt. [facility] [phone]  Yearly Mammogram [facility] Please call the number below to schedule your appt. [facility] Phone: [phone]  Follow up Visits: Next Medicare AWV with our clinical staff: July 22, 2024 at 8:00 am virtual appointment   Have you seen your provider in the last 6 months (3 months if uncontrolled diabetes)? No Next Office Visit with your provider: August 15, 2023 at 2:40 pm in person with Dr. Rodolph Clap  Clinician Recommendations:  Aim for 30 minutes of exercise or brisk walking, 6-8 glasses of water , and 5 servings of fruits and vegetables each day.       This is a list of the screening recommended for you and due dates:  Health Maintenance  Topic Date Due   COVID-19 Vaccine (1 - 2024-25 season) Never done   DEXA scan (bone density measurement)  01/21/2023   Mammogram  01/27/2023   Flu Shot  09/01/2023   Medicare Annual Wellness Visit  07/17/2024   DTaP/Tdap/Td vaccine (3 - Td or Tdap) 04/28/2027   Pneumococcal Vaccine for age over 110  Completed   Hepatitis C Screening  Completed   Zoster (Shingles) Vaccine  Completed   HPV Vaccine  Aged Out   Meningitis B Vaccine  Aged Out   Colon Cancer Screening  Discontinued    Advanced directives: (Declined) Advance directive discussed with you today. Even  though you declined this today, please call our office should you change your mind, and we can give you the proper paperwork for you to fill out. Advance Care Planning is important because it:  [x]  Makes sure you receive the medical care that is consistent with your values, goals, and preferences  [x]  It provides guidance to your family and loved ones and reduces their decisional burden about whether or not they are making the right decisions based on your wishes.  Follow the link provided in your after visit summary or read over the paperwork we have mailed to you to help you started getting your Advance Directives in place. If you need assistance in completing these, please reach out to us  so that we can help you!  Understanding Your Risk for Falls Millions of people have serious injuries from falls each year. It is important to understand your risk of falling. Talk with your health care provider about your risk and what you can do to lower it. If you do have a serious fall, make sure to tell your provider. Falling once raises your risk of falling again. How can falls affect me? Serious injuries from falls are common. These include: Broken bones, such as hip fractures. Head injuries, such as traumatic brain injuries (TBI) or concussions. A fear of falling can cause you to avoid activities and stay at home. This  can make your muscles weaker and raise your risk for a fall. What can increase my risk? There are a number of risk factors that increase your risk for falling. The more risk factors you have, the higher your risk of falling. Serious injuries from a fall happen most often to people who are older than 75 years old. Teenagers and young adults ages 65-29 are also at higher risk. Common risk factors include: Weakness in the lower body. Being generally weak or confused due to long-term (chronic) illness. Dizziness or balance problems. Poor vision. Medicines that cause dizziness or  drowsiness. These may include: Medicines for your blood pressure, heart, anxiety, insomnia, or swelling (edema). Pain medicines. Muscle relaxants. Other risk factors include: Drinking alcohol. Having had a fall in the past. Having foot pain or wearing improper footwear. Working at a dangerous job. Having any of the following in your home: Tripping hazards, such as floor clutter or loose rugs. Poor lighting. Pets. Having dementia or memory loss. What actions can I take to lower my risk of falling?     Physical activity Stay physically fit. Do strength and balance exercises. Consider taking a regular class to build strength and balance. Yoga and tai chi are good options. Vision Have your eyes checked every year and your prescription for glasses or contacts updated as needed. Shoes and walking aids Wear non-skid shoes. Wear shoes that have rubber soles and low heels. Do not wear high heels. Do not walk around the house in socks or slippers. Use a cane or walker as told by your provider. Home safety Attach secure railings on both sides of your stairs. Install grab bars for your bathtub, shower, and toilet. Use a non-skid mat in your bathtub or shower. Attach bath mats securely with double-sided, non-slip rug tape. Use good lighting in all rooms. Keep a flashlight near your bed. Make sure there is a clear path from your bed to the bathroom. Use night-lights. Do not use throw rugs. Make sure all carpeting is taped or tacked down securely. Remove all clutter from walkways and stairways, including extension cords. Repair uneven or broken steps and floors. Avoid walking on icy or slippery surfaces. Walk on the grass instead of on icy or slick sidewalks. Use ice melter to get rid of ice on walkways in the winter. Use a cordless phone. Questions to ask your health care provider Can you help me check my risk for a fall? Do any of my medicines make me more likely to fall? Should I take a  vitamin D  supplement? What exercises can I do to improve my strength and balance? Should I make an appointment to have my vision checked? Do I need a bone density test to check for weak bones (osteoporosis)? Would it help to use a cane or a walker? Where to find more information Centers for Disease Control and Prevention, STEADI: TonerPromos.no Community-Based Fall Prevention Programs: TonerPromos.no [employer] on Aging: BaseRingTones.pl Contact a health care provider if: You fall at home. You are afraid of falling at home. You feel weak, drowsy, or dizzy. This information is not intended to replace advice given to you by your health care provider. Make sure you discuss any questions you have with your health care provider. Document Revised: 09/20/2021 Document Reviewed: 09/20/2021 Elsevier Patient Education  2024 [employer].
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
## 2023-07-31 ENCOUNTER — Ambulatory Visit: Admitting: Gastroenterology

## 2023-07-31 ENCOUNTER — Encounter: Payer: Self-pay | Admitting: Gastroenterology

## 2023-07-31 VITALS — BP 125/71 | HR 89 | Temp 97.9°F | Ht 59.0 in | Wt 152.4 lb

## 2023-07-31 DIAGNOSIS — Z1211 Encounter for screening for malignant neoplasm of colon: Secondary | ICD-10-CM

## 2023-07-31 DIAGNOSIS — K5900 Constipation, unspecified: Secondary | ICD-10-CM

## 2023-07-31 DIAGNOSIS — Z8719 Personal history of other diseases of the digestive system: Secondary | ICD-10-CM

## 2023-07-31 MED ORDER — LUBIPROSTONE 24 MCG PO CAPS
24.0000 ug | ORAL_CAPSULE | Freq: Two times a day (BID) | ORAL | 3 refills | Status: DC
Start: 2023-07-31 — End: 2023-10-26

## 2023-07-31 NOTE — Patient Instructions (Addendum)
 Please have blood work completed at [facility] to check your thyroid  function.  Start taking MiraLAX 17 g (1 capful) twice daily in 8 oz of water  or other non carbonated beverage of your choice.  I am sending in Amitiza 24 mcg for you to take twice daily with a meal for constipation.  If you are not able to obtain this medication, we will see if you qualify for Linzess  patient assistance.  I am going to go ahead and get you scheduled for a colonoscopy with Dr. Cindie at [facility]. You will have extra bowel prep due to your constipation trouble.  I will see you back in the office after your colonoscopy.  It was good to see you again today!  Josette Centers, PA-C [facility]

## 2023-08-01 ENCOUNTER — Telehealth: Payer: Self-pay | Admitting: *Deleted

## 2023-08-01 ENCOUNTER — Ambulatory Visit: Payer: Self-pay | Admitting: Gastroenterology

## 2023-08-01 LAB — TSH+FREE T4
Free T4: 1.03 ng/dL (ref 0.82–1.77)
TSH: 2.64 u[IU]/mL (ref 0.450–4.500)

## 2023-08-01 NOTE — Telephone Encounter (Signed)
 LMOVM to call back to schedule TCS with Dr. Cindie, ASA 3 (ok rm 1-2), 2 days clears, 1.5 bowel prep

## 2023-08-07 ENCOUNTER — Other Ambulatory Visit (HOSPITAL_COMMUNITY)

## 2023-08-07 ENCOUNTER — Ambulatory Visit (HOSPITAL_COMMUNITY)

## 2023-08-15 ENCOUNTER — Ambulatory Visit: Admitting: Family Medicine

## 2023-08-15 MED ORDER — PEG 3350-KCL-NA BICARB-NACL 420 G PO SOLR: ORAL | 0 refills | Status: AC

## 2023-08-15 NOTE — Telephone Encounter (Addendum)
 Spoke with pt. She has been scheduled for 8/14. Aware she will need 1.5 bowel prep and will send 2 rx's in for this. Instructions to be mailed.    Per cohere Information about your requested care Prior authorization is not required for this code. If you would like to submit this code for review, please login to Availity, or contact [employer]; for Medicare call [phone], for Commercial call [phone]

## 2023-08-15 NOTE — Addendum Note (Signed)
 Addended by: JEANELL GRAEME RAMAN on: 08/15/2023 09:21 AM   Modules accepted: Orders

## 2023-08-16 ENCOUNTER — Ambulatory Visit (HOSPITAL_COMMUNITY)
Admission: RE | Admit: 2023-08-16 | Discharge: 2023-08-16 | Disposition: A | Discharge status: Home / Self Care | Source: Ambulatory Visit | Attending: Family Medicine | Admitting: Family Medicine

## 2023-08-16 ENCOUNTER — Encounter (HOSPITAL_COMMUNITY): Payer: Self-pay

## 2023-08-16 ENCOUNTER — Telehealth: Payer: Self-pay

## 2023-08-16 DIAGNOSIS — Z1231 Encounter for screening mammogram for malignant neoplasm of breast: Secondary | ICD-10-CM | POA: Insufficient documentation

## 2023-08-16 DIAGNOSIS — M85852 Other specified disorders of bone density and structure, left thigh: Secondary | ICD-10-CM | POA: Diagnosis not present

## 2023-08-16 DIAGNOSIS — M85851 Other specified disorders of bone density and structure, right thigh: Secondary | ICD-10-CM | POA: Diagnosis not present

## 2023-08-16 DIAGNOSIS — Z780 Asymptomatic menopausal state: Secondary | ICD-10-CM | POA: Insufficient documentation

## 2023-08-16 NOTE — Telephone Encounter (Signed)
 Copied from CRM [id number]. Topic: Medical Record Request - Other >> Aug 16, 2023  9:02 AM Amber Fernandez wrote: Reason for CRM: Pt states she filled out paperwork for her medical records a month ago and was told they would have to be mailed to her. Pt is wanting to know why she hasn't received them yet. Please reach out

## 2023-08-17 NOTE — Telephone Encounter (Signed)
 Were sent last week, should be receiving soon, pt aware

## 2023-08-21 ENCOUNTER — Telehealth: Payer: Self-pay | Admitting: Family Medicine

## 2023-08-21 NOTE — Telephone Encounter (Signed)
 FYI patient now going to [facility] for her care with a primary care physician, closer to home and due to transportation

## 2023-08-24 DIAGNOSIS — I639 Cerebral infarction, unspecified: Secondary | ICD-10-CM | POA: Diagnosis not present

## 2023-08-24 DIAGNOSIS — K5900 Constipation, unspecified: Secondary | ICD-10-CM | POA: Diagnosis not present

## 2023-08-24 DIAGNOSIS — I10 Essential (primary) hypertension: Secondary | ICD-10-CM | POA: Diagnosis not present

## 2023-08-24 DIAGNOSIS — R21 Rash and other nonspecific skin eruption: Secondary | ICD-10-CM | POA: Diagnosis not present

## 2023-08-24 DIAGNOSIS — E785 Hyperlipidemia, unspecified: Secondary | ICD-10-CM | POA: Diagnosis not present

## 2023-09-13 NOTE — Anesthesia Preprocedure Evaluation (Addendum)
 Anesthesia Evaluation  Patient identified by MRN, date of birth, ID band Patient awake    Reviewed: Allergy & Precautions, H&P , NPO status , Patient's Chart, lab work & pertinent test results, reviewed documented beta blocker date and time   Airway Mallampati: II  TM Distance: >3 FB Neck ROM: full    Dental  (+) Lower Dentures, Upper Dentures   Pulmonary neg pulmonary ROS   Pulmonary exam normal breath sounds clear to auscultation       Cardiovascular Exercise Tolerance: Good hypertension, Normal cardiovascular exam Rhythm:regular Rate:Normal     Neuro/Psych  PSYCHIATRIC DISORDERS  Depression    psychosisCVA    GI/Hepatic negative GI ROS, Neg liver ROS,,,  Endo/Other  prediabetes  Renal/GU negative Renal ROS  negative genitourinary   Musculoskeletal  (+) Arthritis , Osteoarthritis,    Abdominal   Peds  Hematology negative hematology ROS (+)   Anesthesia Other Findings   Reproductive/Obstetrics negative OB ROS                              Anesthesia Physical Anesthesia Plan  ASA: 3  Anesthesia Plan: General   Post-op Pain Management: Minimal or no pain anticipated   Induction: Intravenous  PONV Risk Score and Plan: Propofol  infusion  Airway Management Planned: Natural Airway and Nasal Cannula  Additional Equipment: None  Intra-op Plan:   Post-operative Plan:   Informed Consent: I have reviewed the patients History and Physical, chart, labs and discussed the procedure including the risks, benefits and alternatives for the proposed anesthesia with the patient or authorized representative who has indicated his/her understanding and acceptance.     Dental Advisory Given  Plan Discussed with: CRNA  Anesthesia Plan Comments:          Anesthesia Quick Evaluation

## 2023-09-14 ENCOUNTER — Encounter (HOSPITAL_COMMUNITY)
Admission: RE | Disposition: A | Discharge status: Home / Self Care | Payer: Self-pay | Source: Home / Self Care | Attending: Internal Medicine

## 2023-09-14 ENCOUNTER — Ambulatory Visit (HOSPITAL_BASED_OUTPATIENT_CLINIC_OR_DEPARTMENT_OTHER): Payer: Self-pay | Admitting: Anesthesiology

## 2023-09-14 ENCOUNTER — Encounter (HOSPITAL_COMMUNITY): Payer: Self-pay | Admitting: Internal Medicine

## 2023-09-14 ENCOUNTER — Ambulatory Visit (HOSPITAL_COMMUNITY): Payer: Self-pay | Admitting: Anesthesiology

## 2023-09-14 ENCOUNTER — Other Ambulatory Visit: Payer: Self-pay

## 2023-09-14 ENCOUNTER — Ambulatory Visit (HOSPITAL_COMMUNITY)
Admission: RE | Admit: 2023-09-14 | Discharge: 2023-09-14 | Disposition: A | Discharge status: Home / Self Care | Attending: Internal Medicine | Admitting: Internal Medicine

## 2023-09-14 DIAGNOSIS — D120 Benign neoplasm of cecum: Secondary | ICD-10-CM | POA: Diagnosis not present

## 2023-09-14 DIAGNOSIS — Z7982 Long term (current) use of aspirin: Secondary | ICD-10-CM | POA: Diagnosis not present

## 2023-09-14 DIAGNOSIS — Z1211 Encounter for screening for malignant neoplasm of colon: Secondary | ICD-10-CM | POA: Diagnosis not present

## 2023-09-14 DIAGNOSIS — Z8673 Personal history of transient ischemic attack (TIA), and cerebral infarction without residual deficits: Secondary | ICD-10-CM | POA: Insufficient documentation

## 2023-09-14 DIAGNOSIS — K6389 Other specified diseases of intestine: Secondary | ICD-10-CM | POA: Diagnosis not present

## 2023-09-14 DIAGNOSIS — F32A Depression, unspecified: Secondary | ICD-10-CM | POA: Diagnosis not present

## 2023-09-14 DIAGNOSIS — K5730 Diverticulosis of large intestine without perforation or abscess without bleeding: Secondary | ICD-10-CM | POA: Insufficient documentation

## 2023-09-14 DIAGNOSIS — D122 Benign neoplasm of ascending colon: Secondary | ICD-10-CM | POA: Diagnosis not present

## 2023-09-14 DIAGNOSIS — Z79899 Other long term (current) drug therapy: Secondary | ICD-10-CM | POA: Diagnosis not present

## 2023-09-14 DIAGNOSIS — R7303 Prediabetes: Secondary | ICD-10-CM | POA: Insufficient documentation

## 2023-09-14 DIAGNOSIS — I10 Essential (primary) hypertension: Secondary | ICD-10-CM

## 2023-09-14 DIAGNOSIS — M1990 Unspecified osteoarthritis, unspecified site: Secondary | ICD-10-CM | POA: Diagnosis not present

## 2023-09-14 DIAGNOSIS — Z139 Encounter for screening, unspecified: Secondary | ICD-10-CM | POA: Diagnosis not present

## 2023-09-14 HISTORY — PX: COLONOSCOPY: SHX5424

## 2023-09-14 SURGERY — COLONOSCOPY
Anesthesia: General

## 2023-09-14 MED ORDER — LACTATED RINGERS IV SOLN: INTRAVENOUS | Status: DC

## 2023-09-14 MED ORDER — PROPOFOL 500 MG/50ML IV EMUL
INTRAVENOUS | Status: DC | PRN
  Administered 2023-09-14: 40 mg via INTRAVENOUS
  Administered 2023-09-14: 100 ug/kg/min via INTRAVENOUS
  Administered 2023-09-14: 100 mg via INTRAVENOUS

## 2023-09-14 NOTE — Op Note (Signed)
 [facility] Patient Name: Amber Fernandez Procedure Date: 09/14/2023 7:05 AM MRN: [id number] Date of Birth: 1945-04-08 Attending MD: Carlin POUR. Cindie , [place], [id number] CSN: [id number] Age: 78 Admit Type: Outpatient Procedure:                Colonoscopy Indications:              Screening for colorectal malignant neoplasm Providers:                Carlin POUR. Cindie, DO, Jon LABOR. Gerome RN, RN,                            Jon Loge Referring MD:              Medicines:                See the Anesthesia note for documentation of the                            administered medications Complications:            No immediate complications. Estimated Blood Loss:     Estimated blood loss was minimal. Procedure:                Pre-Anesthesia Assessment:                           - The anesthesia plan was to use monitored                            anesthesia care (MAC).                           After obtaining informed consent, the colonoscope                            was passed under direct vision. Throughout the                            procedure, the patient's blood pressure, pulse, and                            oxygen saturations were monitored continuously. The                            [id number]-HQ190L ([id number]) scope was introduced through                            the anus and advanced to the the cecum, identified                            by appendiceal orifice and ileocecal valve. The                            colonoscopy was performed without difficulty. The                            patient tolerated the procedure  well. The quality                            of the bowel preparation was evaluated using the                            BBPS [facility] Bowel Preparation Scale) with scores                            of: Right Colon = 2 (minor amount of residual                            staining, small fragments of stool and/or opaque                            liquid, but  mucosa seen well), Transverse Colon = 2                            (minor amount of residual staining, small fragments                            of stool and/or opaque liquid, but mucosa seen                            well) and Left Colon = 2 (minor amount of residual                            staining, small fragments of stool and/or opaque                            liquid, but mucosa seen well). The total BBPS score                            equals 6. The quality of the bowel preparation was                            good. Scope In: 7:36:43 AM Scope Out: 7:48:44 AM Scope Withdrawal Time: 0 hours 8 minutes 41 seconds  Total Procedure Duration: 0 hours 12 minutes 1 second  Findings:      A few small-mouthed diverticula were found in the sigmoid colon.      Two sessile polyps were found in the ascending colon and cecum. The       polyps were 4 to 5 mm in size. These polyps were removed with a cold       snare. Resection and retrieval were complete.      The exam was otherwise without abnormality. Impression:               - Diverticulosis in the sigmoid colon.                           - Two 4 to 5 mm polyps in the ascending colon and  in the cecum, removed with a cold snare. Resected                            and retrieved.                           - The examination was otherwise normal. Moderate Sedation:      Per Anesthesia Care Recommendation:           - Patient has a contact number available for                            emergencies. The signs and symptoms of potential                            delayed complications were discussed with the                            patient. Return to normal activities tomorrow.                            Written discharge instructions were provided to the                            patient.                           - Resume previous diet.                           - Continue present medications.                            - Await pathology results.                           - No repeat colonoscopy due to age.                           - Return to GI clinic as previously scheduled. Procedure Code(s):        --- Professional ---                           [phone], Colonoscopy, flexible; with removal of                            tumor(s), polyp(s), or other lesion(s) by snare                            technique Diagnosis Code(s):        --- Professional ---                           Z12.11, Encounter for screening for malignant                            neoplasm of colon  D12.2, Benign neoplasm of ascending colon                           D12.0, Benign neoplasm of cecum                           K57.30, Diverticulosis of large intestine without                            perforation or abscess without bleeding CPT copyright 2022 American Medical Association. All rights reserved. The codes documented in this report are preliminary and upon coder review may  be revised to meet current compliance requirements. Carlin POUR. Cindie, DO Carlin POUR. Cindie, DO 09/14/2023 7:54:56 AM This report has been signed electronically. Number of Addenda: 0

## 2023-09-14 NOTE — Anesthesia Postprocedure Evaluation (Signed)
 Anesthesia Post Note  Patient: Amber Fernandez  Procedure(s) Performed: COLONOSCOPY  Patient location during evaluation: Endoscopy Anesthesia Type: General Level of consciousness: awake and alert Pain management: pain level controlled Vital Signs Assessment: post-procedure vital signs reviewed and stable Respiratory status: spontaneous breathing, nonlabored ventilation and respiratory function stable Cardiovascular status: stable Postop Assessment: no apparent nausea or vomiting Anesthetic complications: no   There were no known notable events for this encounter.   Last Vitals:  Vitals:   09/14/23 0752 09/14/23 0758  BP: (!) 103/55 109/62  Pulse: 76   Resp: (!) 25   Temp: (!) 36.4 C   SpO2: 99%     Last Pain:  Vitals:   09/14/23 0752  TempSrc: Oral  PainSc: 0-No pain                 Adalene Gulotta L Debra Colon
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
## 2023-09-14 NOTE — Discharge Instructions (Addendum)
  Colonoscopy Discharge Instructions  Read the instructions outlined below and refer to this sheet in the next few weeks. These discharge instructions provide you with general information on caring for yourself after you leave the hospital. Your doctor may also give you specific instructions. While your treatment has been planned according to the most current medical practices available, unavoidable complications occasionally occur.   ACTIVITY You may resume your regular activity, but move at a slower pace for the next 24 hours.  Take frequent rest periods for the next 24 hours.  Walking will help get rid of the air and reduce the bloated feeling in your belly (abdomen).  No driving for 24 hours (because of the medicine (anesthesia) used during the test).   Do not sign any important legal documents or operate any machinery for 24 hours (because of the anesthesia used during the test).  NUTRITION Drink plenty of fluids.  You may resume your normal diet as instructed by your doctor.  Begin with a light meal and progress to your normal diet. Heavy or fried foods are harder to digest and may make you feel sick to your stomach (nauseated).  Avoid alcoholic beverages for 24 hours or as instructed.  MEDICATIONS You may resume your normal medications unless your doctor tells you otherwise.  WHAT YOU CAN EXPECT TODAY Some feelings of bloating in the abdomen.  Passage of more gas than usual.  Spotting of blood in your stool or on the toilet paper.  IF YOU HAD POLYPS REMOVED DURING THE COLONOSCOPY: No aspirin  products for 7 days or as instructed.  No alcohol for 7 days or as instructed.  Eat a soft diet for the next 24 hours.  FINDING OUT THE RESULTS OF YOUR TEST Not all test results are available during your visit. If your test results are not back during the visit, make an appointment with your caregiver to find out the results. Do not assume everything is normal if you have not heard from your  caregiver or the medical facility. It is important for you to follow up on all of your test results.  SEEK IMMEDIATE MEDICAL ATTENTION IF: You have more than a spotting of blood in your stool.  Your belly is swollen (abdominal distention).  You are nauseated or vomiting.  You have a temperature over 101.  You have abdominal pain or discomfort that is severe or gets worse throughout the day.   Your colonoscopy revealed 2 polyp(s) which I removed successfully. Await pathology results, my office will contact you.  Given your age, I do not think you need further colonoscopies for polyp surveillance.  Follow-up in GI office in 2 to 3 months.  I hope you have a great rest of your week!  Carlin POUR. Cindie, D.O. Gastroenterology and Hepatology [facility]

## 2023-09-14 NOTE — Anesthesia Procedure Notes (Signed)
 Date/Time: 09/14/2023 7:29 AM  Performed by: Barbarann Verneita RAMAN, CRNAPre-anesthesia Checklist: Patient identified, Emergency Drugs available, Suction available, Timeout performed and Patient being monitored Patient Re-evaluated:Patient Re-evaluated prior to induction Oxygen Delivery Method: Nasal Cannula

## 2023-09-14 NOTE — Transfer of Care (Signed)
 Immediate Anesthesia Transfer of Care Note  Patient: Amber Fernandez  Procedure(s) Performed: COLONOSCOPY  Patient Location: Endoscopy Unit  Anesthesia Type:General  Level of Consciousness: awake and patient cooperative  Airway & Oxygen Therapy: Patient Spontanous Breathing  Post-op Assessment: Report given to RN and Post -op Vital signs reviewed and stable  Post vital signs: Reviewed and stable  Last Vitals:  Vitals Value Taken Time  BP 103/55 09/14/23 07:52  Temp 36.4 C 09/14/23 07:52  Pulse 76 09/14/23 07:52  Resp 25 09/14/23 07:52  SpO2 99 % 09/14/23 07:52    Last Pain:  Vitals:   09/14/23 0752  TempSrc: Oral  PainSc: 0-No pain         Complications: No notable events documented.

## 2023-09-15 ENCOUNTER — Encounter (HOSPITAL_COMMUNITY): Payer: Self-pay | Admitting: Internal Medicine

## 2023-09-15 LAB — SURGICAL PATHOLOGY

## 2023-09-23 DIAGNOSIS — R42 Dizziness and giddiness: Secondary | ICD-10-CM | POA: Diagnosis not present

## 2023-09-23 DIAGNOSIS — R7303 Prediabetes: Secondary | ICD-10-CM | POA: Diagnosis not present

## 2023-09-23 DIAGNOSIS — Z79899 Other long term (current) drug therapy: Secondary | ICD-10-CM | POA: Diagnosis not present

## 2023-09-23 DIAGNOSIS — M1991 Primary osteoarthritis, unspecified site: Secondary | ICD-10-CM | POA: Diagnosis not present

## 2023-09-23 DIAGNOSIS — Z8673 Personal history of transient ischemic attack (TIA), and cerebral infarction without residual deficits: Secondary | ICD-10-CM | POA: Diagnosis not present

## 2023-09-23 DIAGNOSIS — R9431 Abnormal electrocardiogram [ECG] [EKG]: Secondary | ICD-10-CM | POA: Diagnosis not present

## 2023-09-23 DIAGNOSIS — R112 Nausea with vomiting, unspecified: Secondary | ICD-10-CM | POA: Diagnosis not present

## 2023-09-23 DIAGNOSIS — E785 Hyperlipidemia, unspecified: Secondary | ICD-10-CM | POA: Diagnosis not present

## 2023-09-23 DIAGNOSIS — I10 Essential (primary) hypertension: Secondary | ICD-10-CM | POA: Diagnosis not present

## 2023-10-03 ENCOUNTER — Ambulatory Visit: Payer: Self-pay | Admitting: Internal Medicine

## 2023-10-26 ENCOUNTER — Other Ambulatory Visit: Payer: Self-pay | Admitting: Gastroenterology

## 2023-10-26 DIAGNOSIS — K5900 Constipation, unspecified: Secondary | ICD-10-CM

## 2023-11-09 DIAGNOSIS — K5900 Constipation, unspecified: Secondary | ICD-10-CM | POA: Insufficient documentation

## 2023-11-10 ENCOUNTER — Encounter: Payer: Self-pay | Admitting: Diagnostic Neuroimaging

## 2023-11-10 ENCOUNTER — Encounter: Payer: Self-pay | Admitting: Internal Medicine

## 2023-11-10 ENCOUNTER — Ambulatory Visit: Admitting: Diagnostic Neuroimaging

## 2023-11-10 VITALS — BP 147/82 | HR 66 | Ht 59.0 in | Wt 146.0 lb

## 2023-11-10 DIAGNOSIS — I6350 Cerebral infarction due to unspecified occlusion or stenosis of unspecified cerebral artery: Secondary | ICD-10-CM | POA: Diagnosis not present

## 2023-11-10 DIAGNOSIS — I10 Essential (primary) hypertension: Secondary | ICD-10-CM
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
## 2023-11-10 NOTE — Patient Instructions (Addendum)
  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

## 2023-12-25 ENCOUNTER — Encounter: Payer: Self-pay | Admitting: Gastroenterology

## 2023-12-25 ENCOUNTER — Ambulatory Visit: Admitting: Gastroenterology

## 2023-12-25 VITALS — BP 138/82 | HR 79 | Temp 97.7°F | Ht 59.0 in | Wt 147.2 lb

## 2023-12-25 DIAGNOSIS — R140 Abdominal distension (gaseous): Secondary | ICD-10-CM

## 2023-12-25 DIAGNOSIS — K5900 Constipation, unspecified: Secondary | ICD-10-CM

## 2023-12-25 DIAGNOSIS — R10816 Epigastric abdominal tenderness: Secondary | ICD-10-CM
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
## 2023-12-25 NOTE — Patient Instructions (Addendum)
 I am ordering the [facility] breath test for you to complete. Once you receive the test in the mail I want you to stop all medications that help you use the bathroom (Amitiza , MiraLAX, and any stool softeners) for 7 days and then perform the breath test per the instructions provided to you.  I want you to continue taking your Amitiza  and MiraLAX until time to stop for the breath test.  If all this is negative then I want you to start taking famotidine (Pepcid) 20 mg once daily in the morning to see if this helps you with the tenderness in your upper belly and the bloating.  Avoid/limit gas-producing foods (eg, cabbage, legumes, onions, broccoli, brussel sprouts, wheat, and potatoes).  Also minimize drinking through a straw, sucking on hard candy, and chewing gum as this all introduces extra air into the upper abdomen and can cause extra bloating.  I did attach a handout to the back of your paperwork today with some more information about bloating.  Follow-up in 6-8 weeks.  It was a pleasure to see you today. I want to create trusting relationships with patients. If you receive a survey regarding your visit,  I greatly appreciate you taking time to fill this out on paper or through your MyChart. I value your feedback.  Charmaine Melia, MSN, FNP-BC, AGACNP-BC [facility]

## 2024-01-15 ENCOUNTER — Telehealth: Payer: Self-pay | Admitting: Gastroenterology

## 2024-01-15 NOTE — Telephone Encounter (Signed)
 She does not use mychart therefore please send her the detailed educational information below in the mail and let her know about the results over the phone.   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 The recommendation is for combination of Xifaxin and neomycin  as well as following a diet low in methane producing foods.   Diet considerations need to be made: low fodmap. Low sulfur, and low fermentation.   Low FODMAP diet:  Foods to Avoid (High-FODMAP):  Garlic and onions Wheat, rye, and barley (in large amounts) Milk, yogurt, and soft cheeses (lactose) Apples, pears, watermelon, and stone fruits Beans, lentils, and chickpeas Sweeteners like sorbitol, mannitol, and xylitol  Foods to Choose (Low-FODMAP):  Carrots, spinach, zucchini, and bell peppers Bananas, blueberries, grapes, and oranges Lactose-free milk, hard cheeses, and plant-based milks (like almond) Gluten-free grains (rice, oats, quinoa) Chicken, eggs, tofu, and most fish Olive oil and small amounts of maple syrup   Low-Fermentation Eating: A Sustainable Long-Term Strategy Due to the limitations and nutritional concerns associated with indefinite adherence to the Low-FODMAP Diet, Dr. Oneil Millin developed a more sustainable dietary approach: Low-Fermentation Eating (LFE). LFE focuses on reducing fermentable foods while emphasizing protein-rich meals to minimize bacterial overgrowth.  Foods to Avoid:  Beans and legumes (lentils, chickpeas, black beans) Cruciferous vegetables (cabbage, Brussels sprouts, broccoli, cauliflower, kale) Whole grains (whole wheat breads, multigrain breads, oats) Dairy products with lactose (milk, yogurt, soft cheeses) Certain fruits (apples, pears, bananas) Sweeteners (sucralose/Splenda, sugar alcohols like sorbitol, xylitol) Gums and thickeners (gum Arabic, xanthan gum, carrageenan)  Foods to Choose:  Refined carbohydrates:  White bread (sourdough, French, potato bread), white rice Proteins: Beef, chicken, fish, pork, eggs Non-cruciferous vegetables: Peppers, tomatoes, carrots, cucumbers, zucchini, squash, eggplant, peas, mushrooms, potatoes, sweet potatoes Fruits: Most fruits (limit apples, pears, bananas) Dairy: Lactose-free milk, hard cheeses (cheddar, Parmesan) Nuts and seeds: Almonds, walnuts, pecans, macadamia nuts, sunflower seeds, pumpkin seeds Oils and fats: Olive oil, coconut oil, butter (in moderation)  Meal Timing Recommendations:  Structured meals without frequent snacking At least 4-hour gaps between meals Avoid late-night eating to support gut motility    Low-Sulfur Diet:  Foods to Avoid (High in Sulfur):  Cruciferous vegetables: Broccoli, cauliflower, cabbage, Brussels sprouts, kale, bok choy, arugula Alliums (very high in sulfur): Garlic, onions, leeks, shallots, chives Animal proteins (especially high-sulfur): Eggs (especially yolks), red meat (beef, lamb, pork), organ meats, shellfish Legumes and soy products: Lentils, beans (black, kidney, pinto, etc.), soy milk, tofu, tempeh Nuts and seeds (high in sulfur amino acids): Almonds, cashews, walnuts, Brazil nuts, sunflower seeds, sesame seeds Dried fruits and processed foods with sulfites: Dried apricots, raisins, wine, cider, packaged sauces, deli meats (if preserved with sulfites) Additives and preservatives: Sulfites, sulfur dioxide (check ingredient labels)  Low Sulfur Diet is a dietary approach that limits the intake of foods high in sulfur- containing compounds, such as certain proteins, cruciferous vegetables, garlic, onions, and eggs.  Foods to Choose (Low in Sulfur):  Fruits: Apples, berries, citrus fruits, grapes, melons, peaches, plums Vegetables (low-sulfur): Carrots, cucumbers, zucchini, lettuce, spinach, bell peppers, celery, green beans, squash, eggplant, potatoes Proteins (low-sulfur in moderation): Chicken breast (small  portions), white fish (e.g., tilapia, cod), turkey breast, plant-based protein powders (pea or rice-based) Grains and Starches: White rice, oats, quinoa, gluten-free bread, white bread, pasta (non-enriched) Dairy (low-sulfur options): Lactose-free milk, unsweetened almond milk, coconut milk, hard cheeses (in moderation) Fats and Oils: Olive oil, coconut oil, avocado oil Beverages: Herbal teas (peppermint, ginger), water , diluted fruit  juices

## 2024-01-16 ENCOUNTER — Encounter: Payer: Self-pay | Admitting: *Deleted

## 2024-01-16 ENCOUNTER — Other Ambulatory Visit: Payer: Self-pay | Admitting: Gastroenterology

## 2024-01-16 DIAGNOSIS — K63829 Intestinal methanogen overgrowth, unspecified: Secondary | ICD-10-CM

## 2024-01-16 MED ORDER — RIFAXIMIN 550 MG PO TABS: 550.0000 mg | ORAL_TABLET | Freq: Three times a day (TID) | ORAL | 0 refills | Status: DC

## 2024-01-16 MED ORDER — NEOMYCIN SULFATE 500 MG PO TABS: 500.0000 mg | ORAL_TABLET | Freq: Two times a day (BID) | ORAL | 0 refills | Status: AC

## 2024-01-16 NOTE — Telephone Encounter (Signed)
 Noted! Thank you

## 2024-01-16 NOTE — Telephone Encounter (Signed)
Mailed pt information.

## 2024-01-18 ENCOUNTER — Encounter: Payer: Self-pay | Admitting: Gastroenterology

## 2024-01-18 ENCOUNTER — Ambulatory Visit: Payer: Self-pay | Admitting: Gastroenterology

## 2024-02-02 ENCOUNTER — Other Ambulatory Visit: Payer: Self-pay

## 2024-02-02 DIAGNOSIS — K63829 Intestinal methanogen overgrowth, unspecified: Secondary | ICD-10-CM

## 2024-02-02 MED ORDER — RIFAXIMIN 550 MG PO TABS: 550.0000 mg | ORAL_TABLET | Freq: Three times a day (TID) | ORAL | 0 refills | Status: AC

## 2024-02-02 NOTE — Progress Notes (Signed)
 Rx resent to pharmacy due to past Rx expiring before PA was approved.

## 2024-02-05 ENCOUNTER — Telehealth: Payer: Self-pay | Admitting: *Deleted

## 2024-02-05 NOTE — Telephone Encounter (Signed)
 Pt called and states that Amber Fernandez  makes her stomach cramp at the bottom. She states that she has been having regular bowel movements for the last couple of days. She states that she is still taking the MiraLax, but stopped the Amber Fernandez  2 days ago. She states her stomach is bloated and and has a knot on the side.

## 2024-02-07 NOTE — Telephone Encounter (Signed)
 Spoke to pt, informed her of results and recommendations. Pt voiced understanding. Pt stated that the knot is in her mid stomach area after she eats and she thinks its just bloating. She states she is having more normal bowel movement. She states she would wait until she sees you next week and talk to you about the medication then.   FYI- The Xifaxan  was denied. Pt needs to sign up for low income subsidy. Informed pt of recommendations.

## 2024-02-13 ENCOUNTER — Encounter: Payer: Self-pay | Admitting: Gastroenterology

## 2024-02-13 ENCOUNTER — Ambulatory Visit: Admitting: Gastroenterology

## 2024-02-13 VITALS — BP 136/79 | HR 83 | Temp 98.0°F | Ht 59.0 in | Wt 144.4 lb

## 2024-02-13 DIAGNOSIS — R630 Anorexia: Secondary | ICD-10-CM | POA: Diagnosis not present

## 2024-02-13 DIAGNOSIS — R140 Abdominal distension (gaseous): Secondary | ICD-10-CM | POA: Diagnosis not present

## 2024-02-13 DIAGNOSIS — K5909 Other constipation: Secondary | ICD-10-CM

## 2024-02-13 DIAGNOSIS — R634 Abnormal weight loss: Secondary | ICD-10-CM | POA: Diagnosis not present

## 2024-02-13 DIAGNOSIS — K63829 Intestinal methanogen overgrowth, unspecified: Secondary | ICD-10-CM

## 2024-02-13 DIAGNOSIS — K5900 Constipation, unspecified: Secondary | ICD-10-CM

## 2024-02-13 DIAGNOSIS — R10816 Epigastric abdominal tenderness: Secondary | ICD-10-CM

## 2024-02-13 NOTE — Patient Instructions (Addendum)
 Id like for you to trial FDgard and/or IBgard to see if this helps with your fullness and upper abdominal discomfort.  You may also benefit from some over-the-counter famotidine.    Please focus on maintaining protein and healthy fats to make the most out of calories to avoid further weight loss.  I attached a handout about high-protein and high-calorie foods for you.  If you continue to have weight loss then we likely should get some abdominal imaging like a CT scan of your abdomen and pelvis.  I would like to do this if it is not done by the provider that you will be seeing for your bladder.  I'd like to assess you for pancreatic insufficiency to see if that is why you have had weight loss and if that is contributing to your abdominal discomfort as well.   Please call the number on the form I gave you about getting signed up for the low income subsidy with Medicare part D for help in coverage with the Xifaxin.  If you are declined the LIS then at that point you may potentially qualify for the patient assistance with drug company.   Please keep a hold on your neomycin  until we are able to get you the Xifaxan .   For now continue taking your MiraLAX as you have been given you are having some mild improvement in your constipation.  It was a pleasure to see you today. I want to create trusting relationships with patients. If you receive a survey regarding your visit,  I greatly appreciate you taking time to fill this out on paper or through your MyChart. I value your feedback.  Charmaine Melia, MSN, FNP-BC, AGACNP-BC [facility]
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
## 2024-02-23 ENCOUNTER — Telehealth: Payer: Self-pay | Admitting: *Deleted

## 2024-02-23 NOTE — Telephone Encounter (Signed)
 Pt called and states she was denied Medicare Part D Low-Income Subsidy. I informed her to bring me that letter so I can fax it with her application to [employer]. She voiced understanding.

## 2024-02-23 NOTE — Telephone Encounter (Signed)
 Pt would like to know if there is any thing that is like Xifaxan  she could take. She is concerned about the bacteria in her stomach

## 2024-02-29 NOTE — Telephone Encounter (Signed)
 Spoke to pt, informed her of recommendations. Pt states she has changed diet and that has helped some.

## 2024-07-22 ENCOUNTER — Ambulatory Visit
